# Patient Record
Sex: Female | Born: 1960 | Hispanic: No | Marital: Married | Smoking: Former smoker
Health system: Southern US, Community
[De-identification: ages and names within clinical notes are randomized; demographics above are authoritative.]

## PROBLEM LIST (undated history)

## (undated) DIAGNOSIS — J449 Chronic obstructive pulmonary disease, unspecified: Secondary | ICD-10-CM

## (undated) DIAGNOSIS — F419 Anxiety disorder, unspecified: Secondary | ICD-10-CM

## (undated) DIAGNOSIS — E785 Hyperlipidemia, unspecified: Secondary | ICD-10-CM

## (undated) DIAGNOSIS — E039 Hypothyroidism, unspecified: Secondary | ICD-10-CM

## (undated) DIAGNOSIS — I10 Essential (primary) hypertension: Secondary | ICD-10-CM

## (undated) DIAGNOSIS — E119 Type 2 diabetes mellitus without complications: Secondary | ICD-10-CM

## (undated) DIAGNOSIS — F329 Major depressive disorder, single episode, unspecified: Secondary | ICD-10-CM

## (undated) DIAGNOSIS — F32A Depression, unspecified: Secondary | ICD-10-CM

## (undated) DIAGNOSIS — F319 Bipolar disorder, unspecified: Secondary | ICD-10-CM

## (undated) DIAGNOSIS — K7460 Unspecified cirrhosis of liver: Secondary | ICD-10-CM

## (undated) DIAGNOSIS — K7581 Nonalcoholic steatohepatitis (NASH): Secondary | ICD-10-CM

## (undated) HISTORY — PX: CHOLECYSTECTOMY: SHX55

---

## 2004-12-20 ENCOUNTER — Ambulatory Visit (HOSPITAL_COMMUNITY): Admission: RE | Admit: 2004-12-20 | Discharge: 2004-12-20 | Payer: Self-pay | Admitting: Cardiology

## 2004-12-20 ENCOUNTER — Ambulatory Visit: Payer: Self-pay | Admitting: Cardiology

## 2011-06-26 ENCOUNTER — Other Ambulatory Visit: Payer: Self-pay | Admitting: Internal Medicine

## 2011-06-26 DIAGNOSIS — K7460 Unspecified cirrhosis of liver: Secondary | ICD-10-CM

## 2011-07-15 ENCOUNTER — Inpatient Hospital Stay: Admission: RE | Admit: 2011-07-15 | Payer: Self-pay | Source: Ambulatory Visit

## 2014-10-20 ENCOUNTER — Other Ambulatory Visit: Payer: Self-pay | Admitting: *Deleted

## 2014-10-20 DIAGNOSIS — L97919 Non-pressure chronic ulcer of unspecified part of right lower leg with unspecified severity: Principal | ICD-10-CM

## 2014-10-20 DIAGNOSIS — I83019 Varicose veins of right lower extremity with ulcer of unspecified site: Secondary | ICD-10-CM

## 2014-11-24 ENCOUNTER — Encounter: Payer: Self-pay | Admitting: Vascular Surgery

## 2014-11-25 ENCOUNTER — Encounter (HOSPITAL_COMMUNITY): Payer: Self-pay

## 2014-11-25 ENCOUNTER — Encounter: Payer: Self-pay | Admitting: Vascular Surgery

## 2015-02-07 ENCOUNTER — Telehealth: Payer: Self-pay | Admitting: *Deleted

## 2015-02-07 NOTE — Telephone Encounter (Signed)
The orders were in his outbox this morning.  I have called [facility] and spoke with Debbie to let her know they were signed/faxed in error and that Dr. Daiva EvesVan Dam is NOT following this patient.  Eunice BlaseDebbie will work with pharmacy/nursing to find the correct provider. Rebakah Cokley  ===View-only below this line===  ----- Message -----    From: Glorious PeachKimberly A Marley    Sent: 02/07/2015   4:19 PM      To: Andree CossMichelle M Deverick Pruss, RN Subject: FW: NEW PATIENT APPOINTMENT?                   Hi Micalah Cabezas -   Pls see below from Dr. Daiva EvesVan Dam.    You may be able to explain this better to him about this [facility] patient.  Thanks Kim   ----- Message -----    From: Randall Hissornelius N Van Dam, MD    Sent: 02/07/2015   4:05 PM      To: Glorious PeachKimberly A Marley Subject: RE: NEW PATIENT APPOINTMENT?                   I should NEVER have signed any order for  Her. I believe she [facility] on antibiotics that I had recommended but I was never asked to manage her antibiotics nor to see the patient. [facility] had called me about a critical lab on her and I told them to send her to the ED and that I was not in charge of her antibiotics.  I cannot have signed orders for her because I have never seen her and have not been in clinic for 2 weeks other than today  [facility] needs to straighten out WHO sent her out on antibiotics because it was NOT me since i dont work at [employer] ----- Message -----    From: Glorious PeachKimberly A Marley    Sent: 02/07/2015  12:03 PM      To: Randall Hissornelius N Van Dam, MD Subject: NEW PATIENT APPOINTMENT?                       Kees -   Do you know anything about Ms. Newcombe?  [facility] in [facility].  An order has been signed by you  HomeHealth dx is diabetic ulcer - 6 wk IV antibiotic.  Charitie Hinote in clinic called.   Does this patient need to be seen in our clinic?   Thanks, Selena BattenKim

## 2015-02-08 NOTE — Telephone Encounter (Signed)
Thanks Michelle

## 2015-10-20 DIAGNOSIS — K7460 Unspecified cirrhosis of liver: Secondary | ICD-10-CM

## 2015-10-20 DIAGNOSIS — K7581 Nonalcoholic steatohepatitis (NASH): Secondary | ICD-10-CM

## 2015-10-20 DIAGNOSIS — E039 Hypothyroidism, unspecified: Secondary | ICD-10-CM

## 2015-10-20 DIAGNOSIS — D696 Thrombocytopenia, unspecified: Secondary | ICD-10-CM

## 2015-10-20 DIAGNOSIS — N179 Acute kidney failure, unspecified: Secondary | ICD-10-CM | POA: Diagnosis not present

## 2015-10-21 DIAGNOSIS — K7581 Nonalcoholic steatohepatitis (NASH): Secondary | ICD-10-CM | POA: Diagnosis not present

## 2015-10-21 DIAGNOSIS — N289 Disorder of kidney and ureter, unspecified: Secondary | ICD-10-CM

## 2015-10-21 DIAGNOSIS — D696 Thrombocytopenia, unspecified: Secondary | ICD-10-CM | POA: Diagnosis not present

## 2015-10-21 DIAGNOSIS — N179 Acute kidney failure, unspecified: Secondary | ICD-10-CM | POA: Diagnosis not present

## 2015-10-22 DIAGNOSIS — N289 Disorder of kidney and ureter, unspecified: Secondary | ICD-10-CM | POA: Diagnosis not present

## 2015-10-22 DIAGNOSIS — K7581 Nonalcoholic steatohepatitis (NASH): Secondary | ICD-10-CM | POA: Diagnosis not present

## 2015-10-22 DIAGNOSIS — N179 Acute kidney failure, unspecified: Secondary | ICD-10-CM | POA: Diagnosis not present

## 2015-10-22 DIAGNOSIS — D696 Thrombocytopenia, unspecified: Secondary | ICD-10-CM | POA: Diagnosis not present

## 2015-10-23 DIAGNOSIS — D696 Thrombocytopenia, unspecified: Secondary | ICD-10-CM | POA: Diagnosis not present

## 2015-10-23 DIAGNOSIS — K7581 Nonalcoholic steatohepatitis (NASH): Secondary | ICD-10-CM | POA: Diagnosis not present

## 2015-10-23 DIAGNOSIS — N289 Disorder of kidney and ureter, unspecified: Secondary | ICD-10-CM | POA: Diagnosis not present

## 2015-10-23 DIAGNOSIS — N179 Acute kidney failure, unspecified: Secondary | ICD-10-CM | POA: Diagnosis not present

## 2015-10-29 DIAGNOSIS — K7460 Unspecified cirrhosis of liver: Secondary | ICD-10-CM

## 2015-10-29 DIAGNOSIS — N179 Acute kidney failure, unspecified: Secondary | ICD-10-CM | POA: Diagnosis not present

## 2015-10-29 DIAGNOSIS — L0390 Cellulitis, unspecified: Secondary | ICD-10-CM

## 2015-10-29 DIAGNOSIS — D638 Anemia in other chronic diseases classified elsewhere: Secondary | ICD-10-CM

## 2015-10-29 DIAGNOSIS — E7220 Disorder of urea cycle metabolism, unspecified: Secondary | ICD-10-CM

## 2015-10-29 DIAGNOSIS — L03119 Cellulitis of unspecified part of limb: Secondary | ICD-10-CM

## 2015-10-30 DIAGNOSIS — K7460 Unspecified cirrhosis of liver: Secondary | ICD-10-CM | POA: Diagnosis not present

## 2015-10-30 DIAGNOSIS — D638 Anemia in other chronic diseases classified elsewhere: Secondary | ICD-10-CM | POA: Diagnosis not present

## 2015-10-30 DIAGNOSIS — E7220 Disorder of urea cycle metabolism, unspecified: Secondary | ICD-10-CM | POA: Diagnosis not present

## 2015-10-30 DIAGNOSIS — N179 Acute kidney failure, unspecified: Secondary | ICD-10-CM | POA: Diagnosis not present

## 2015-10-31 ENCOUNTER — Encounter (HOSPITAL_COMMUNITY): Payer: Self-pay | Admitting: Internal Medicine

## 2015-10-31 ENCOUNTER — Inpatient Hospital Stay (HOSPITAL_COMMUNITY)
Admission: AD | Admit: 2015-10-31 | Discharge: 2015-11-04 | DRG: 442 | Disposition: A | Discharge status: Other Acute Inpatient Hospital | Payer: Medicare Other | Source: Other Acute Inpatient Hospital | Attending: Internal Medicine | Admitting: Internal Medicine

## 2015-10-31 DIAGNOSIS — E872 Acidosis: Secondary | ICD-10-CM | POA: Diagnosis present

## 2015-10-31 DIAGNOSIS — S91301A Unspecified open wound, right foot, initial encounter: Secondary | ICD-10-CM | POA: Diagnosis present

## 2015-10-31 DIAGNOSIS — K7290 Hepatic failure, unspecified without coma: Secondary | ICD-10-CM | POA: Diagnosis present

## 2015-10-31 DIAGNOSIS — N179 Acute kidney failure, unspecified: Secondary | ICD-10-CM | POA: Diagnosis present

## 2015-10-31 DIAGNOSIS — E11610 Type 2 diabetes mellitus with diabetic neuropathic arthropathy: Secondary | ICD-10-CM | POA: Diagnosis present

## 2015-10-31 DIAGNOSIS — E785 Hyperlipidemia, unspecified: Secondary | ICD-10-CM | POA: Diagnosis present

## 2015-10-31 DIAGNOSIS — D689 Coagulation defect, unspecified: Secondary | ICD-10-CM | POA: Diagnosis present

## 2015-10-31 DIAGNOSIS — D638 Anemia in other chronic diseases classified elsewhere: Secondary | ICD-10-CM | POA: Diagnosis not present

## 2015-10-31 DIAGNOSIS — Z87891 Personal history of nicotine dependence: Secondary | ICD-10-CM | POA: Diagnosis not present

## 2015-10-31 DIAGNOSIS — E11621 Type 2 diabetes mellitus with foot ulcer: Secondary | ICD-10-CM | POA: Diagnosis present

## 2015-10-31 DIAGNOSIS — K7460 Unspecified cirrhosis of liver: Secondary | ICD-10-CM | POA: Diagnosis present

## 2015-10-31 DIAGNOSIS — N189 Chronic kidney disease, unspecified: Secondary | ICD-10-CM

## 2015-10-31 DIAGNOSIS — E1122 Type 2 diabetes mellitus with diabetic chronic kidney disease: Secondary | ICD-10-CM | POA: Diagnosis present

## 2015-10-31 DIAGNOSIS — S91301D Unspecified open wound, right foot, subsequent encounter: Secondary | ICD-10-CM | POA: Diagnosis not present

## 2015-10-31 DIAGNOSIS — K767 Hepatorenal syndrome: Secondary | ICD-10-CM | POA: Diagnosis present

## 2015-10-31 DIAGNOSIS — I959 Hypotension, unspecified: Secondary | ICD-10-CM | POA: Diagnosis present

## 2015-10-31 DIAGNOSIS — Z882 Allergy status to sulfonamides status: Secondary | ICD-10-CM | POA: Diagnosis not present

## 2015-10-31 DIAGNOSIS — I739 Peripheral vascular disease, unspecified: Secondary | ICD-10-CM | POA: Diagnosis present

## 2015-10-31 DIAGNOSIS — E039 Hypothyroidism, unspecified: Secondary | ICD-10-CM | POA: Diagnosis present

## 2015-10-31 DIAGNOSIS — E7220 Disorder of urea cycle metabolism, unspecified: Secondary | ICD-10-CM | POA: Diagnosis not present

## 2015-10-31 DIAGNOSIS — J441 Chronic obstructive pulmonary disease with (acute) exacerbation: Secondary | ICD-10-CM | POA: Diagnosis present

## 2015-10-31 DIAGNOSIS — R188 Other ascites: Secondary | ICD-10-CM | POA: Diagnosis present

## 2015-10-31 DIAGNOSIS — Z886 Allergy status to analgesic agent status: Secondary | ICD-10-CM

## 2015-10-31 DIAGNOSIS — K7581 Nonalcoholic steatohepatitis (NASH): Secondary | ICD-10-CM | POA: Diagnosis present

## 2015-10-31 DIAGNOSIS — E1151 Type 2 diabetes mellitus with diabetic peripheral angiopathy without gangrene: Secondary | ICD-10-CM | POA: Diagnosis present

## 2015-10-31 DIAGNOSIS — F319 Bipolar disorder, unspecified: Secondary | ICD-10-CM | POA: Diagnosis present

## 2015-10-31 DIAGNOSIS — Z881 Allergy status to other antibiotic agents status: Secondary | ICD-10-CM | POA: Diagnosis not present

## 2015-10-31 DIAGNOSIS — R109 Unspecified abdominal pain: Secondary | ICD-10-CM | POA: Diagnosis present

## 2015-10-31 DIAGNOSIS — I10 Essential (primary) hypertension: Secondary | ICD-10-CM | POA: Diagnosis present

## 2015-10-31 HISTORY — DX: Major depressive disorder, single episode, unspecified: F32.9

## 2015-10-31 HISTORY — DX: Hypothyroidism, unspecified: E03.9

## 2015-10-31 HISTORY — DX: Hyperlipidemia, unspecified: E78.5

## 2015-10-31 HISTORY — DX: Depression, unspecified: F32.A

## 2015-10-31 HISTORY — DX: Unspecified cirrhosis of liver: K74.60

## 2015-10-31 HISTORY — DX: Essential (primary) hypertension: I10

## 2015-10-31 HISTORY — DX: Type 2 diabetes mellitus without complications: E11.9

## 2015-10-31 HISTORY — DX: Anxiety disorder, unspecified: F41.9

## 2015-10-31 HISTORY — DX: Nonalcoholic steatohepatitis (NASH): K75.81

## 2015-10-31 HISTORY — DX: Bipolar disorder, unspecified: F31.9

## 2015-10-31 HISTORY — DX: Chronic obstructive pulmonary disease, unspecified: J44.9

## 2015-10-31 LAB — URINALYSIS W MICROSCOPIC (NOT AT ARMC)
Bilirubin Urine: NEGATIVE
GLUCOSE, UA: NEGATIVE mg/dL
KETONES UR: NEGATIVE mg/dL
NITRITE: NEGATIVE
PH: 5 (ref 5.0–8.0)
PROTEIN: 30 mg/dL — AB
Specific Gravity, Urine: 1.024 (ref 1.005–1.030)

## 2015-10-31 LAB — MRSA PCR SCREENING: MRSA BY PCR: NEGATIVE

## 2015-10-31 LAB — GLUCOSE, CAPILLARY: Glucose-Capillary: 131 mg/dL — ABNORMAL HIGH (ref 65–99)

## 2015-10-31 LAB — SODIUM, URINE, RANDOM

## 2015-10-31 MED ORDER — SODIUM CHLORIDE 0.9 % IV SOLN: 12.5000 mg | Freq: Four times a day (QID) | INTRAVENOUS | Status: DC | PRN

## 2015-10-31 MED ORDER — METOCLOPRAMIDE HCL 5 MG PO TABS
5.0000 mg | ORAL_TABLET | Freq: Two times a day (BID) | ORAL | Status: DC
  Administered 2015-10-31 – 2015-11-03 (×7): 5 mg via ORAL
  Filled 2015-10-31 (×7): qty 1

## 2015-10-31 MED ORDER — PROMETHAZINE HCL 25 MG/ML IJ SOLN
12.5000 mg | Freq: Four times a day (QID) | INTRAMUSCULAR | Status: DC | PRN
  Administered 2015-11-01 – 2015-11-02 (×2): 12.5 mg via INTRAVENOUS
  Filled 2015-10-31 (×2): qty 1

## 2015-10-31 MED ORDER — ONDANSETRON HCL 4 MG/2ML IJ SOLN
4.0000 mg | Freq: Four times a day (QID) | INTRAMUSCULAR | Status: DC | PRN
Start: 2015-10-31 — End: 2015-11-04
  Administered 2015-11-03: 4 mg via INTRAVENOUS
  Filled 2015-10-31: qty 2

## 2015-10-31 MED ORDER — LACTULOSE 10 GM/15ML PO SOLN
20.0000 g | Freq: Two times a day (BID) | ORAL | Status: DC
  Administered 2015-10-31 – 2015-11-03 (×7): 20 g via ORAL
  Filled 2015-10-31 (×7): qty 30

## 2015-10-31 MED ORDER — IPRATROPIUM-ALBUTEROL 0.5-2.5 (3) MG/3ML IN SOLN
3.0000 mL | Freq: Four times a day (QID) | RESPIRATORY_TRACT | Status: DC
Start: 2015-10-31 — End: 2015-10-31
  Administered 2015-10-31: 3 mL via RESPIRATORY_TRACT
  Filled 2015-10-31: qty 3

## 2015-10-31 MED ORDER — ONDANSETRON HCL 4 MG PO TABS: 4.0000 mg | ORAL_TABLET | Freq: Four times a day (QID) | ORAL | Status: DC | PRN

## 2015-10-31 MED ORDER — DEXTROSE 5 % IV SOLN
2.0000 g | INTRAVENOUS | Status: DC
  Administered 2015-10-31 – 2015-11-03 (×4): 2 g via INTRAVENOUS
  Filled 2015-10-31 (×5): qty 2

## 2015-10-31 MED ORDER — IPRATROPIUM-ALBUTEROL 0.5-2.5 (3) MG/3ML IN SOLN
3.0000 mL | Freq: Two times a day (BID) | RESPIRATORY_TRACT | Status: DC
  Administered 2015-11-01: 3 mL via RESPIRATORY_TRACT
  Filled 2015-10-31: qty 3

## 2015-10-31 MED ORDER — HYDROMORPHONE HCL 1 MG/ML IJ SOLN: 0.5000 mg | INTRAMUSCULAR | Status: DC | PRN

## 2015-10-31 MED ORDER — CIPROFLOXACIN HCL 500 MG PO TABS
500.0000 mg | ORAL_TABLET | Freq: Two times a day (BID) | ORAL | Status: DC
  Administered 2015-10-31 – 2015-11-01 (×3): 500 mg via ORAL
  Filled 2015-10-31 (×4): qty 1

## 2015-10-31 MED ORDER — SODIUM CHLORIDE 0.9% FLUSH
3.0000 mL | Freq: Two times a day (BID) | INTRAVENOUS | Status: DC
  Administered 2015-10-31 – 2015-11-03 (×6): 3 mL via INTRAVENOUS

## 2015-10-31 MED ORDER — BUPROPION HCL ER (SR) 100 MG PO TB12
200.0000 mg | ORAL_TABLET | Freq: Two times a day (BID) | ORAL | Status: DC
  Administered 2015-10-31 – 2015-11-03 (×7): 200 mg via ORAL
  Filled 2015-10-31 (×9): qty 2

## 2015-10-31 MED ORDER — SODIUM CHLORIDE 0.9 % IV SOLN
50.0000 ug/h | INTRAVENOUS | Status: DC
  Administered 2015-10-31 – 2015-11-02 (×2): 12.5 ug/h via INTRAVENOUS
  Administered 2015-11-03: 50 ug/h via INTRAVENOUS
  Filled 2015-10-31 (×6): qty 1

## 2015-10-31 MED ORDER — PANTOPRAZOLE SODIUM 40 MG PO TBEC
40.0000 mg | DELAYED_RELEASE_TABLET | Freq: Every day | ORAL | Status: DC
  Administered 2015-10-31 – 2015-11-03 (×4): 40 mg via ORAL
  Filled 2015-10-31 (×4): qty 1

## 2015-10-31 MED ORDER — FLUTICASONE PROPIONATE 50 MCG/ACT NA SUSP
1.0000 | Freq: Every day | NASAL | Status: DC | PRN
  Filled 2015-10-31: qty 16

## 2015-10-31 MED ORDER — OXYCODONE HCL 5 MG PO TABS
10.0000 mg | ORAL_TABLET | ORAL | Status: DC | PRN
  Administered 2015-10-31 – 2015-11-03 (×12): 10 mg via ORAL
  Filled 2015-10-31 (×12): qty 2

## 2015-10-31 MED ORDER — LEVOTHYROXINE SODIUM 50 MCG PO TABS
50.0000 ug | ORAL_TABLET | Freq: Every day | ORAL | Status: DC
  Administered 2015-11-01 – 2015-11-03 (×3): 50 ug via ORAL
  Filled 2015-10-31 (×3): qty 1

## 2015-10-31 NOTE — H&P (Signed)
History and Physical    Bridget Shaw GOT:[id number] DOB: [birth year] DOA: 10/31/2015  PCP: Jerold Coombe   Patient coming from: [facility].  Chief Complaint: Ascites.  HPI: Bridget Shaw is a 55 y.o. female with medical history significant of liver cirrhosis secondary to [place], anxiety, bipolar disorder, COPD, depression, type 2 diabetes, hyperlipidemia, hypothyroidism who was transferred from [facility] due to decompensated ascites with worsening renal function.   The patient was admitted on 10/20/2015 with confusion and mild dehydration after large volume paracentesis was done. She was was discharged home on diuretics a few days, but returned again on 10/29/2015 to [facility] due to refractory ascites. She had a paracentesis done on 10/30/2015, but only 4 L of ascitic fluid were drained due to concern  with her renal function. Her renal function has been steadily climbing from a baseline of 1.2 mg/dL about 6 months ago to 2.5 mg/dL earlier today.  Due to this, Dr. Mahala Menghini spoke to the patient's hepatologist at [facility] who recommended to transfer the patient for evaluation of TIPS procedure to see if this would benefit the patient. Unfortunately, [facility] did not have any beds available and the patient agreed to come to [facility]. Interventional radiology and nephrology have been consulted.   Review of Systems: As per HPI otherwise 10 point review of systems negative.    Past Medical History:  Diagnosis Date  . Anxiety   . Bipolar disorder (HCC)   . COPD (chronic obstructive pulmonary disease) (HCC)   . Depression   . Diabetes mellitus without complication (HCC)   . Hyperlipidemia   . Hypertension   . Hypothyroidism   . Liver cirrhosis secondary to NASH     Past Surgical History:  Procedure Laterality Date  . CHOLECYSTECTOMY       reports that she quit smoking 12 days ago. She has never used smokeless tobacco. She reports that she does not drink  alcohol or use drugs.  Allergies  Allergen Reactions  . Asa [Aspirin] Hives  . Bactrim [Sulfamethoxazole-Trimethoprim] Hives  . Cephalexin Hives  . Sulfa Antibiotics Hives    Family History  Problem Relation Age of Onset  . Lung cancer Mother   . Diabetes Mother   . Hypertension Mother   . Diabetes Father   . Hypertension Father     Prior to Admission medications   Not on File    Physical Exam: Vitals:   10/31/15 2016 10/31/15 2234  BP: (!) 88/63   Pulse: 88   Resp: (!) 23   Temp: 99.3 F (37.4 C)   SpO2: 100% 100%  Weight: 112 kg (247 lb)   Height: 5\' 4"  (1.626 m)       Constitutional: NAD, calm, comfortable Vitals:   10/31/15 2016 10/31/15 2234  BP: (!) 88/63   Pulse: 88   Resp: (!) 23   Temp: 99.3 F (37.4 C)   SpO2: 100% 100%  Weight: 112 kg (247 lb)   Height: 5\' 4"  (1.626 m)    Eyes: PERRL, Icteric sclerae, lids and conjunctivae normal ENMT: Mucous membranes are moist. Posterior pharynx clear of any exudate or lesions. Neck: normal, supple, no masses, no thyromegaly Respiratory: Clear to auscultation bilaterally, no wheezing, no crackles. Normal respiratory effort.  Cardiovascular: Regular rate and rhythm, no murmurs / rubs / gallops. No extremity edema. 2+ pedal pulses. No carotid bruits.  Abdomen: Severely distended, mild diffuse tenderness without guarding or rebound, no masses palpated. Bowel sounds positive.  Musculoskeletal: Right foot ulcer  extending through the posterior and lateral aspect of the heel. No discharge noticed. Skin: Multiple ecchymosis area on extremities and lower back. Neurologic: CN 2-12 grossly intact. Sensation intact, DTR normal. Generalized weakness.  Psychiatric: Normal judgment and insight. Alert and oriented x 4. Normal mood.     Labs on Admission: I have personally reviewed following labs and imaging studies  CBG:  Recent Labs Lab 10/31/15 2014  GLUCAP 131*    Recent Results (from the past 240 hour(s))    MRSA PCR Screening     Status: None   Collection Time: 10/31/15  8:14 PM  Result Value Ref Range Status   MRSA by PCR NEGATIVE NEGATIVE Final    Comment:        The GeneXpert MRSA Assay (FDA approved for NASAL specimens only), is one component of a comprehensive MRSA colonization surveillance program. It is not intended to diagnose MRSA infection nor to guide or monitor treatment for MRSA infections.        Assessment/Plan Principal Problem:   Acute on chronic renal failure (HCC) Admit to telemetry/inpatient. Check urinalysis. Check random urine sodium. Monitor renal function daily. Continue albumin infusion as needed. Continue octreotide infusion. Consider adding Midodrine  Nephrology will evaluate in the morning.  Active Problems:   Ascites Continue IV Rocephin to cover for SBP. Interventional radiology will evaluate for TIPS procedure in the morning. Spironolactone, furosemide and propranolol have been held. Also the patient has a relatively low MELD score, the presence of renal involvement increases her mortality risk.    Abdominal pain Continue analgesics as needed as blood pressure allows.    Bipolar disorder (HCC) Stable. Continue bupropion 200 mg by mouth every 12 hours.      COPD (HCC) Continue supplemental oxygen. Bronchodilators as needed.    Chronic wound of right foot   Peripheral vascular disease (HCC) Continue local wound care. Continue ciprofloxacin 500 mg by mouth twice a day   DVT prophylaxis: SCDs. Code Status: Full code. Family Communication:  Disposition Plan: Admit for evaluation by IR and nephrology. Consults called: Interventional radiology (Dr. Archer Asa) and nephrology (Dr. Kathrene Bongo). Admission status: Inpatient/telemetry.   Bobette Mo MD [facility] Pager [phone].  If 7PM-7AM, please contact night-coverage [email or web address] Password [facility]  10/31/2015, 10:47 PM

## 2015-11-01 ENCOUNTER — Inpatient Hospital Stay (HOSPITAL_COMMUNITY): Payer: Medicare Other

## 2015-11-01 ENCOUNTER — Encounter (HOSPITAL_COMMUNITY): Payer: Self-pay | Admitting: General Surgery

## 2015-11-01 DIAGNOSIS — K7460 Unspecified cirrhosis of liver: Secondary | ICD-10-CM | POA: Diagnosis present

## 2015-11-01 DIAGNOSIS — K7290 Hepatic failure, unspecified without coma: Secondary | ICD-10-CM | POA: Diagnosis present

## 2015-11-01 DIAGNOSIS — S91301D Unspecified open wound, right foot, subsequent encounter: Secondary | ICD-10-CM

## 2015-11-01 LAB — COMPREHENSIVE METABOLIC PANEL
ALBUMIN: 3.4 g/dL — AB (ref 3.5–5.0)
ALT: 14 U/L (ref 14–54)
AST: 17 U/L (ref 15–41)
Alkaline Phosphatase: 77 U/L (ref 38–126)
Anion gap: 8 (ref 5–15)
BILIRUBIN TOTAL: 1 mg/dL (ref 0.3–1.2)
BUN: 41 mg/dL — AB (ref 6–20)
CHLORIDE: 106 mmol/L (ref 101–111)
CO2: 18 mmol/L — AB (ref 22–32)
Calcium: 7.9 mg/dL — ABNORMAL LOW (ref 8.9–10.3)
Creatinine, Ser: 2.73 mg/dL — ABNORMAL HIGH (ref 0.44–1.00)
GFR calc Af Amer: 22 mL/min — ABNORMAL LOW (ref >60)
GFR calc non Af Amer: 19 mL/min — ABNORMAL LOW (ref >60)
GLUCOSE: 148 mg/dL — AB (ref 65–99)
POTASSIUM: 4.2 mmol/L (ref 3.5–5.1)
SODIUM: 132 mmol/L — AB (ref 135–145)
TOTAL PROTEIN: 5.9 g/dL — AB (ref 6.5–8.1)

## 2015-11-01 LAB — CBC WITH DIFFERENTIAL/PLATELET
BASOS ABS: 0 10*3/uL (ref 0.0–0.1)
Basophils Relative: 0 %
EOS ABS: 0.2 10*3/uL (ref 0.0–0.7)
Eosinophils Relative: 3 %
HCT: 32.1 % — ABNORMAL LOW (ref 36.0–46.0)
HEMOGLOBIN: 10.2 g/dL — AB (ref 12.0–15.0)
LYMPHS ABS: 0.9 10*3/uL (ref 0.7–4.0)
LYMPHS PCT: 15 %
MCH: 27 pg (ref 26.0–34.0)
MCHC: 31.8 g/dL (ref 30.0–36.0)
MCV: 84.9 fL (ref 78.0–100.0)
Monocytes Absolute: 0.5 10*3/uL (ref 0.1–1.0)
Monocytes Relative: 8 %
NEUTROS PCT: 73 %
Neutro Abs: 4.5 10*3/uL (ref 1.7–7.7)
Platelets: 91 10*3/uL — ABNORMAL LOW (ref 150–400)
RBC: 3.78 MIL/uL — AB (ref 3.87–5.11)
RDW: 16.6 % — ABNORMAL HIGH (ref 11.5–15.5)
WBC: 6.1 10*3/uL (ref 4.0–10.5)

## 2015-11-01 LAB — PHOSPHORUS: Phosphorus: 5.9 mg/dL — ABNORMAL HIGH (ref 2.5–4.6)

## 2015-11-01 LAB — GLUCOSE, CAPILLARY
GLUCOSE-CAPILLARY: 139 mg/dL — AB (ref 65–99)
GLUCOSE-CAPILLARY: 135 mg/dL — AB (ref 65–99)
Glucose-Capillary: 125 mg/dL — ABNORMAL HIGH (ref 65–99)
Glucose-Capillary: 130 mg/dL — ABNORMAL HIGH (ref 65–99)

## 2015-11-01 LAB — MAGNESIUM: Magnesium: 2.1 mg/dL (ref 1.7–2.4)

## 2015-11-01 LAB — PROTIME-INR
INR: 1.62
PROTHROMBIN TIME: 19.3 s — AB (ref 11.4–15.2)

## 2015-11-01 MED ORDER — ONDANSETRON HCL 4 MG PO TABS: 4.0000 mg | ORAL_TABLET | Freq: Four times a day (QID) | ORAL | 0 refills | Status: DC | PRN

## 2015-11-01 MED ORDER — SODIUM CHLORIDE 0.9 % IV SOLN: 12.5000 ug/h | INTRAVENOUS | 0 refills | Status: AC

## 2015-11-01 MED ORDER — ALBUMIN HUMAN 25 % IV SOLN
25.0000 g | Freq: Once | INTRAVENOUS | Status: DC
  Filled 2015-11-01: qty 100

## 2015-11-01 MED ORDER — DEXTROSE 5 % IV SOLN: 2.0000 g | INTRAVENOUS | 0 refills | Status: DC

## 2015-11-01 MED ORDER — IPRATROPIUM-ALBUTEROL 0.5-2.5 (3) MG/3ML IN SOLN: 3.0000 mL | Freq: Four times a day (QID) | RESPIRATORY_TRACT | Status: DC | PRN

## 2015-11-01 MED ORDER — ALBUMIN HUMAN 25 % IV SOLN
25.0000 g | Freq: Three times a day (TID) | INTRAVENOUS | Status: DC
  Administered 2015-11-01 – 2015-11-03 (×8): 25 g via INTRAVENOUS
  Filled 2015-11-01 (×9): qty 100

## 2015-11-01 MED ORDER — SODIUM CHLORIDE 0.9 % IV SOLN
INTRAVENOUS | Status: DC
  Administered 2015-11-01 – 2015-11-02 (×2): via INTRAVENOUS

## 2015-11-01 MED ORDER — MIDODRINE HCL 5 MG PO TABS
10.0000 mg | ORAL_TABLET | Freq: Three times a day (TID) | ORAL | Status: DC
  Administered 2015-11-01 – 2015-11-03 (×8): 10 mg via ORAL
  Filled 2015-11-01 (×8): qty 2

## 2015-11-01 MED ORDER — CETYLPYRIDINIUM CHLORIDE 0.05 % MT LIQD
7.0000 mL | Freq: Two times a day (BID) | OROMUCOSAL | Status: DC
  Administered 2015-11-01 – 2015-11-03 (×5): 7 mL via OROMUCOSAL

## 2015-11-01 MED ORDER — MIDODRINE HCL 10 MG PO TABS: 10.0000 mg | ORAL_TABLET | Freq: Three times a day (TID) | ORAL | 0 refills | Status: DC

## 2015-11-01 NOTE — Consult Note (Addendum)
[facility] Consult Note  Referring Provider: No ref. provider found Primary Care Physician:  Hilbert Corrigan Primary Gastroenterologist:  Dr.  Laurel Dimmer Complaint: Abdominal tightness and shortness of breath HPI: Bridget Shaw is an 55 y.o. white female  with at least a 7 year history of cirrhosis presumed secondary to Karlene Lineman, who presents one week after recent admission-per a Hospital for refractory ascites and encephalopathy with recurrent refractory ascites unresponsive to a 2 L paracentesis 2 days ago. In addition she has had a rising creatinine which is now 2.6. I'm not certain over how long a period that has risen. The patient was referred to [facility] where she is a patient was last seen there 7 months ago but they did not have an available bed and was recommend she come here, specifically mentioning the possibility of a TI PS. She is complaining of generalized abdominal pain. She has never had any GI bleeding and has never consumed alcohol.  Past Medical History:  Diagnosis Date  . Anxiety   . Bipolar disorder ([place])   . COPD (chronic obstructive pulmonary disease) ([place])   . Depression   . Diabetes mellitus without complication ([place])   . Hyperlipidemia   . Hypertension   . Hypothyroidism   . Liver cirrhosis secondary to NASH     Past Surgical History:  Procedure Laterality Date  . CHOLECYSTECTOMY      No prescriptions prior to admission.    Allergies:  Allergies  Allergen Reactions  . Asa [Aspirin] Hives  . Bactrim [Sulfamethoxazole-Trimethoprim] Hives  . Cephalexin Hives  . Sulfa Antibiotics Hives    Family History  Problem Relation Age of Onset  . Lung cancer Mother   . Diabetes Mother   . Hypertension Mother   . Diabetes Father   . Hypertension Father     Social History:  reports that she quit smoking about 1 weeks ago. She has never used smokeless tobacco. She reports that she does not drink alcohol or use drugs.  Review of Systems:  negative except As above   Blood pressure 101/64, pulse (!) 104, temperature 98.8 F (37.1 C), resp. rate 20, height _0  (1.626 m), weight 112 kg (247 lb), SpO2 97 %. Head: Normocephalic, without obvious abnormality, atraumatic Neck: no adenopathy, no carotid bruit, no JVD, supple, symmetrical, trachea midline and thyroid not enlarged, symmetric, no tenderness/mass/nodules Resp: clear to auscultation bilaterally Cardio: regular rate and rhythm, S1, S2 normal, no murmur, click, rub or gallop GI: Abdomen grossly and symmetrically distended. Mild diffuse tenderness with obvious ascites. Extremities: extremities normal, atraumatic, no cyanosis or edema  Results for orders placed or performed during the hospital encounter of 10/31/15 (from the past 48 hour(s))  MRSA PCR Screening     Status: None   Collection Time: 10/31/15  8:14 PM  Result Value Ref Range   MRSA by PCR NEGATIVE NEGATIVE    Comment:        The GeneXpert MRSA Assay (FDA approved for NASAL specimens only), is one component of a comprehensive MRSA colonization surveillance program. It is not intended to diagnose MRSA infection nor to guide or monitor treatment for MRSA infections.   Glucose, capillary     Status: Abnormal   Collection Time: 10/31/15  8:14 PM  Result Value Ref Range   Glucose-Capillary 131 (H) 65 - 99 mg/dL  Sodium, urine, random     Status: None   Collection Time: 10/31/15 10:51 PM  Result Value Ref Range   Sodium, Ur <10 mmol/L  Urinalysis with microscopic (not at [facility])     Status: Abnormal   Collection Time: 10/31/15 10:51 PM  Result Value Ref Range   Color, Urine AMBER (A) YELLOW    Comment: BIOCHEMICALS MAY BE AFFECTED BY COLOR   APPearance CLOUDY (A) CLEAR   Specific Gravity, Urine 1.024 1.005 - 1.030   pH 5.0 5.0 - 8.0   Glucose, UA NEGATIVE NEGATIVE mg/dL   Hgb urine dipstick LARGE (A) NEGATIVE   Bilirubin Urine NEGATIVE NEGATIVE   Ketones, ur NEGATIVE NEGATIVE mg/dL   Protein, ur 30  (A) NEGATIVE mg/dL   Nitrite NEGATIVE NEGATIVE   Leukocytes, UA SMALL (A) NEGATIVE   WBC, UA 0-5 0 - 5 WBC/hpf   RBC / HPF TOO NUMEROUS TO COUNT 0 - 5 RBC/hpf   Bacteria, UA RARE (A) NONE SEEN   Squamous Epithelial / LPF 0-5 (A) NONE SEEN   Casts HYALINE CASTS (A) NEGATIVE  CBC WITH DIFFERENTIAL     Status: Abnormal   Collection Time: 11/01/15  3:31 AM  Result Value Ref Range   WBC 6.1 4.0 - 10.5 K/uL   RBC 3.78 (L) 3.87 - 5.11 MIL/uL   Hemoglobin 10.2 (L) 12.0 - 15.0 g/dL   HCT 32.1 (L) 36.0 - 46.0 %   MCV 84.9 78.0 - 100.0 fL   MCH 27.0 26.0 - 34.0 pg   MCHC 31.8 30.0 - 36.0 g/dL   RDW 16.6 (H) 11.5 - 15.5 %   Platelets 91 (L) 150 - 400 K/uL    Comment: SPECIMEN CHECKED FOR CLOTS PLATELET COUNT CONFIRMED BY SMEAR    Neutrophils Relative % 73 %   Neutro Abs 4.5 1.7 - 7.7 K/uL   Lymphocytes Relative 15 %   Lymphs Abs 0.9 0.7 - 4.0 K/uL   Monocytes Relative 8 %   Monocytes Absolute 0.5 0.1 - 1.0 K/uL   Eosinophils Relative 3 %   Eosinophils Absolute 0.2 0.0 - 0.7 K/uL   Basophils Relative 0 %   Basophils Absolute 0.0 0.0 - 0.1 K/uL  Protime-INR     Status: Abnormal   Collection Time: 11/01/15  3:31 AM  Result Value Ref Range   Prothrombin Time 19.3 (H) 11.4 - 15.2 seconds   INR 1.62   Comprehensive metabolic panel     Status: Abnormal   Collection Time: 11/01/15  3:31 AM  Result Value Ref Range   Sodium 132 (L) 135 - 145 mmol/L   Potassium 4.2 3.5 - 5.1 mmol/L   Chloride 106 101 - 111 mmol/L   CO2 18 (L) 22 - 32 mmol/L   Glucose, Bld 148 (H) 65 - 99 mg/dL   BUN 41 (H) 6 - 20 mg/dL   Creatinine, Ser 2.73 (H) 0.44 - 1.00 mg/dL   Calcium 7.9 (L) 8.9 - 10.3 mg/dL   Total Protein 5.9 (L) 6.5 - 8.1 g/dL   Albumin 3.4 (L) 3.5 - 5.0 g/dL   AST 17 15 - 41 U/L   ALT 14 14 - 54 U/L   Alkaline Phosphatase 77 38 - 126 U/L   Total Bilirubin 1.0 0.3 - 1.2 mg/dL   GFR calc non Af Amer 19 (L) >60 mL/min   GFR calc Af Amer 22 (L) >60 mL/min    Comment: (NOTE) The eGFR has been  calculated using the CKD EPI equation. This calculation has not been validated in all clinical situations. eGFR's persistently <60 mL/min signify possible Chronic Kidney Disease.    Anion gap 8 5 - 15  Magnesium  Status: None   Collection Time: 11/01/15  3:31 AM  Result Value Ref Range   Magnesium 2.1 1.7 - 2.4 mg/dL  Phosphorus     Status: Abnormal   Collection Time: 11/01/15  3:31 AM  Result Value Ref Range   Phosphorus 5.9 (H) 2.5 - 4.6 mg/dL  Glucose, capillary     Status: Abnormal   Collection Time: 11/01/15  7:35 AM  Result Value Ref Range   Glucose-Capillary 139 (H) 65 - 99 mg/dL   No results found.  Assessment: 1. Presumed Karlene Lineman cirrhosis 2. Refractory ascites 3. 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 At least some degree of previous encephalopathy Plan:  1. I would recommend at least a diagnostic paracentesis and up to 2 L removal of fluid for palliation of dyspnea unless renal strongly disagrees 2. If paracentesis not done, would treat empirically for SBP with Rocephin or equivalent 3. Re: TIPS, the patient has a meld score of 21 and is a child-pugh class B minus. For the indication of refractory ascites she is probably acceptable but quite high risk, especially for encephalopathy but also in terms of overall mortality 4. Re: TIPS for the indication of hepatorenal syndrome there is some limited case study data supporting some benefit in selected cases but the American Association for study of liver diseases does not recommended pending more controlled studies. 5. Agree with renal and IR consults for ruling out any other causes of azotemia and input re: TIPS. 6. Overall I think this patient should be referred to [facility] when a bed is available. She has not been seen there in 8 months and has never been evaluated for liver transplant candidacy. Given her young age and absence of alcohol use or any other medically treatable etiology of her cirrhosis, I think transplant status  needs to be reconsidered very soon. Charne Mcbrien C 11/01/2015, 11:30 AM  Pager [phone] If no answer or after 5 PM call [phone]

## 2015-11-01 NOTE — Consult Note (Signed)
Chief Complaint: NASH with cirrhosis  Referring Physician:Dr. Tennis Must  Supervising Physician: Markus Daft  Patient Status: In-pt   HPI: Bridget Shaw is an 55 y.o. female with multiple medical problems who has developed NASH.  She has been developing ascites and underwent a large volume paracentesis recently at [facility].  She has since developed some renal failure from this and partly the reason for transfer to [facility].  She is normally controlled on Lasix and Spironolactone for her ascites.  She has had some encephalopathy about 2 weeks ago when she did know who she was, etc.  This has since improved.  She has been followed at [facility] by a hepatologist.  She has not seen them in 7 months, I believe.  Due to her worsening hepatorenal syndrome, we have been asked to evaluate the patient for a TIPS procedure to help with her fluid accumulation.  Past Medical History:  Past Medical History:  Diagnosis Date  . Anxiety   . Bipolar disorder ([place])   . COPD (chronic obstructive pulmonary disease) ([place])   . Depression   . Diabetes mellitus without complication ([place])   . Hyperlipidemia   . Hypertension   . Hypothyroidism   . Liver cirrhosis secondary to NASH     Past Surgical History:  Past Surgical History:  Procedure Laterality Date  . CHOLECYSTECTOMY      Family History:  Family History  Problem Relation Age of Onset  . Lung cancer Mother   . Diabetes Mother   . Hypertension Mother   . Diabetes Father   . Hypertension Father     Social History:  reports that she quit smoking about 1 weeks ago. She has never used smokeless tobacco. She reports that she does not drink alcohol or use drugs.  Allergies:  Allergies  Allergen Reactions  . Asa [Aspirin] Hives  . Bactrim [Sulfamethoxazole-Trimethoprim] Hives  . Cephalexin Hives  . Sulfa Antibiotics Hives    Medications:   Medication List    ASK your doctor about these medications   buPROPion 200 MG 12 hr  tablet Commonly known as:  WELLBUTRIN SR Take 1 tablet by mouth 2 (two) times daily.   ciprofloxacin 500 MG tablet Commonly known as:  CIPRO Take 1 tablet by mouth 2 (two) times daily.   clotrimazole 1 % cream Commonly known as:  LOTRIMIN Apply 1 application topically daily as needed.   COMBIVENT RESPIMAT 20-100 MCG/ACT Aers respimat Generic drug:  Ipratropium-Albuterol Inhale 2 puffs into the lungs daily as needed.   furosemide 40 MG tablet Commonly known as:  LASIX Take 1 tablet by mouth 2 (two) times daily.   lactulose 10 GM/15ML solution Commonly known as:  CHRONULAC Take 30 mLs by mouth daily.   levothyroxine 50 MCG tablet Commonly known as:  SYNTHROID, LEVOTHROID Take 1 tablet by mouth every morning.   LYRICA 100 MG capsule Generic drug:  pregabalin Take 1 capsule by mouth 2 (two) times daily.   mometasone 50 MCG/ACT nasal spray Commonly known as:  NASONEX Place 1 spray into the nose daily as needed.   nystatin powder Commonly known as:  MYCOSTATIN/NYSTOP Apply 1 application topically daily. Apply to right foot after shower   omeprazole 40 MG capsule Commonly known as:  PRILOSEC Take 1 capsule by mouth 2 (two) times daily.   oxyCODONE 15 MG immediate release tablet Commonly known as:  ROXICODONE Take 1-2 tablets by mouth See admin instructions. Pt takes 2 tablets in the morning, and 1 tablet  before bed   pravastatin 20 MG tablet Commonly known as:  PRAVACHOL Take 1 tablet by mouth every evening.   propranolol 20 MG tablet Commonly known as:  INDERAL Take 1 tablet by mouth 2 (two) times daily.   SUMAtriptan 100 MG tablet Commonly known as:  IMITREX Take 100 mg by mouth every 2 (two) hours as needed for migraine.   traZODone 100 MG tablet Commonly known as:  DESYREL Take 1 tablet by mouth at bedtime.       Please HPI for pertinent positives, otherwise complete 10 system ROS negative, except for chronic right plantar foot wound, nausea/vomiting  occasionally, dyspnea secondary to fluid accumulation.  Mallampati Score:    Physical Exam: BP 101/64 (BP Location: Right Arm)   Pulse (!) 104   Temp 98.8 F (37.1 C)   Resp 20   Ht 5' 4" (1.626 m)   Wt 247 lb (112 kg)   SpO2 97%   BMI 42.40 kg/m  Body mass index is 42.4 kg/m. General: chronically ill appearing white female who is laying in bed in NAD HEENT: head is normocephalic, atraumatic.  Sclera are noninjected.  PERRL.  Ears and nose without any masses or lesions.  Mouth is pink and moist.   Heart: regular, rate, and rhythm.  Normal s1,s2. No obvious murmurs, gallops, or rubs noted.  Palpable radial and pedal pulses bilaterally Lungs: CTAB, no wheezes, rhonchi, or rales noted.  Respiratory effort nonlabored Abd: distended, tight secondary to ascites, +BS, minimal tenderness to palpation. No appreciated hernias, unable to feel for organomegaly MS: her upper extremities are thin and atrophied, while her lower extremities have significant pitting edema and chronic venous stasis changes.  She has a chronic plantar right foot wound. Psych: A&Ox3 with an appropriate affect.   Labs: Results for orders placed or performed during the hospital encounter of 10/31/15 (from the past 48 hour(s))  MRSA PCR Screening     Status: None   Collection Time: 10/31/15  8:14 PM  Result Value Ref Range   MRSA by PCR NEGATIVE NEGATIVE    Comment:        The GeneXpert MRSA Assay (FDA approved for NASAL specimens only), is one component of a comprehensive MRSA colonization surveillance program. It is not intended to diagnose MRSA infection nor to guide or monitor treatment for MRSA infections.   Glucose, capillary     Status: Abnormal   Collection Time: 10/31/15  8:14 PM  Result Value Ref Range   Glucose-Capillary 131 (H) 65 - 99 mg/dL  Sodium, urine, random     Status: None   Collection Time: 10/31/15 10:51 PM  Result Value Ref Range   Sodium, Ur <10 mmol/L  Urinalysis with microscopic  (not at [facility])     Status: Abnormal   Collection Time: 10/31/15 10:51 PM  Result Value Ref Range   Color, Urine AMBER (A) YELLOW    Comment: BIOCHEMICALS MAY BE AFFECTED BY COLOR   APPearance CLOUDY (A) CLEAR   Specific Gravity, Urine 1.024 1.005 - 1.030   pH 5.0 5.0 - 8.0   Glucose, UA NEGATIVE NEGATIVE mg/dL   Hgb urine dipstick LARGE (A) NEGATIVE   Bilirubin Urine NEGATIVE NEGATIVE   Ketones, ur NEGATIVE NEGATIVE mg/dL   Protein, ur 30 (A) NEGATIVE mg/dL   Nitrite NEGATIVE NEGATIVE   Leukocytes, UA SMALL (A) NEGATIVE   WBC, UA 0-5 0 - 5 WBC/hpf   RBC / HPF TOO NUMEROUS TO COUNT 0 - 5 RBC/hpf   Bacteria,  UA RARE (A) NONE SEEN   Squamous Epithelial / LPF 0-5 (A) NONE SEEN   Casts HYALINE CASTS (A) NEGATIVE  CBC WITH DIFFERENTIAL     Status: Abnormal   Collection Time: 11/01/15  3:31 AM  Result Value Ref Range   WBC 6.1 4.0 - 10.5 K/uL   RBC 3.78 (L) 3.87 - 5.11 MIL/uL   Hemoglobin 10.2 (L) 12.0 - 15.0 g/dL   HCT 32.1 (L) 36.0 - 46.0 %   MCV 84.9 78.0 - 100.0 fL   MCH 27.0 26.0 - 34.0 pg   MCHC 31.8 30.0 - 36.0 g/dL   RDW 16.6 (H) 11.5 - 15.5 %   Platelets 91 (L) 150 - 400 K/uL    Comment: SPECIMEN CHECKED FOR CLOTS PLATELET COUNT CONFIRMED BY SMEAR    Neutrophils Relative % 73 %   Neutro Abs 4.5 1.7 - 7.7 K/uL   Lymphocytes Relative 15 %   Lymphs Abs 0.9 0.7 - 4.0 K/uL   Monocytes Relative 8 %   Monocytes Absolute 0.5 0.1 - 1.0 K/uL   Eosinophils Relative 3 %   Eosinophils Absolute 0.2 0.0 - 0.7 K/uL   Basophils Relative 0 %   Basophils Absolute 0.0 0.0 - 0.1 K/uL  Protime-INR     Status: Abnormal   Collection Time: 11/01/15  3:31 AM  Result Value Ref Range   Prothrombin Time 19.3 (H) 11.4 - 15.2 seconds   INR 1.62   Comprehensive metabolic panel     Status: Abnormal   Collection Time: 11/01/15  3:31 AM  Result Value Ref Range   Sodium 132 (L) 135 - 145 mmol/L   Potassium 4.2 3.5 - 5.1 mmol/L   Chloride 106 101 - 111 mmol/L   CO2 18 (L) 22 - 32 mmol/L    Glucose, Bld 148 (H) 65 - 99 mg/dL   BUN 41 (H) 6 - 20 mg/dL   Creatinine, Ser 2.73 (H) 0.44 - 1.00 mg/dL   Calcium 7.9 (L) 8.9 - 10.3 mg/dL   Total Protein 5.9 (L) 6.5 - 8.1 g/dL   Albumin 3.4 (L) 3.5 - 5.0 g/dL   AST 17 15 - 41 U/L   ALT 14 14 - 54 U/L   Alkaline Phosphatase 77 38 - 126 U/L   Total Bilirubin 1.0 0.3 - 1.2 mg/dL   GFR calc non Af Amer 19 (L) >60 mL/min   GFR calc Af Amer 22 (L) >60 mL/min    Comment: (NOTE) The eGFR has been calculated using the CKD EPI equation. This calculation has not been validated in all clinical situations. eGFR's persistently <60 mL/min signify possible Chronic Kidney Disease.    Anion gap 8 5 - 15  Magnesium     Status: None   Collection Time: 11/01/15  3:31 AM  Result Value Ref Range   Magnesium 2.1 1.7 - 2.4 mg/dL  Phosphorus     Status: Abnormal   Collection Time: 11/01/15  3:31 AM  Result Value Ref Range   Phosphorus 5.9 (H) 2.5 - 4.6 mg/dL  Glucose, capillary     Status: Abnormal   Collection Time: 11/01/15  7:35 AM  Result Value Ref Range   Glucose-Capillary 139 (H) 65 - 99 mg/dL  Glucose, capillary     Status: Abnormal   Collection Time: 11/01/15 12:09 PM  Result Value Ref Range   Glucose-Capillary 135 (H) 65 - 99 mg/dL    Imaging: No results found.  Assessment/Plan 1. NASH -the patient has a MELD score today of  24.  She has a 3 month 20% mortality rate secondary to just her cirrhosis.  If she were to undergo a TIPS procedure without a definite bridge to a transplant, she would be at even higher risk for post procedure mortality as well as encephalopathy, etc.  Dr. Anselm Pancoast discussed this with the patient.  She has seen a hepatologist at [facility] in the past and apparently had discussed the possibility of a liver transplant.  Dr. Anselm Pancoast has discussed this patient with Dr. Amedeo Plenty as well.  We agree that a TIPS procedure alone is likely too high risk for this patient.  We would recommend transfer to [facility] for evaluation for transplant vs  TIPS with bridge to transplant.  The patient understands this and is agreeable to this plan. -we will continue to follow the patient while she is here for now.  Thank you for this interesting consult.  I greatly enjoyed meeting Bridget Shaw and look forward to participating in their care.  A copy of this report was sent to the requesting provider on this date.  Electronically Signed: Henreitta Cea 11/01/2015, 1:43 PM   I spent a total of 40 Minutes    in face to face in clinical consultation, greater than 50% of which was counseling/coordinating care for NASH with cirrhosis and ascites

## 2015-11-01 NOTE — Consult Note (Signed)
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 Per patient wound started over a year ago with small black area that her podiatrist decided to "cut on" and she developed "gangrene" after that. She is currently non ambulatory per patient report after her last hospitalization Pressure Ulcer POA: No Measurement:9cm x 5cm x 0.2cm  Wound DXI:PJAS, 100% pink, moist, some early granulation and evidence of re-epithelialization  Drainage (amount, consistency, odor) moderate, serosanguinous, not purulent  Periwound: intact, some hyperkeratosis Dressing procedure/placement/frequency: Silver hydrofiber for recalcitrant wound, antimicrobial effects. Antifungal powder to the periwound and foot per patient request to "keep foot dry".  Top with dry gauze, secure with kerlix. Change every other day.  Follow up with regular wound care center appointments at [facility].  Discussed POC with patient and bedside nurse.  Re consult if needed, will not follow at this time. Thanks  Dorien Mayotte [employer], RN,CNS, CWOCN [phone])

## 2015-11-01 NOTE — Consult Note (Signed)
Reason for Consult: Hepatorenal Syndrome Referring Physician: Dr. Reubin Milan  HPI: Bridget Shaw is an 55 y.o. Female with a past medical history significant for liver cirrhosis secondary to NASH, DM2, Hypothyroidism, COPD, bipolar disorder who was transferred from [facility] following large volume paracentesis with worsening renal function.  Patient was initially admitted to [facility] on 7/14 with ascites. Underwent large volume paracentesis at that time. Subsequently discharged on home dose Lasix and Spironolactone. Patient reports her ascites rapidly reaccumulated and her urine output decreased. She reports urinating only once daily with small volume during this time. She returned to [facility] on 7/23 for ascites with worsening abdominal pain and shortness of breath. Underwent paracentesis on 7/24 with 4L removal. Subsequently had jump in her Cr and was transferred for further care. Appears patients Cr was 1.2 about 6 months ago with a steady incline to 2.5 on arrival.  Today, she complains of abdominal distention and discomfort with shortness of breath. No recent fevers or chills. Does report decreased urine output. No blood in urine. Does report nausea with emesis this morning. No hematemesis.    Past Medical History:  Diagnosis Date  . Anxiety   . Bipolar disorder ([place])   . COPD (chronic obstructive pulmonary disease) ([place])   . Depression   . Diabetes mellitus without complication ([place])   . Hyperlipidemia   . Hypertension   . Hypothyroidism   . Liver cirrhosis secondary to NASH     Past Surgical History:  Procedure Laterality Date  . CHOLECYSTECTOMY      Family History  Problem Relation Age of Onset  . Lung cancer Mother   . Diabetes Mother   . Hypertension Mother   . Diabetes Father   . Hypertension Father     Social History:  reports that she quit smoking about 1 weeks ago. She has never used smokeless tobacco. She reports that she does not  drink alcohol or use drugs.  Allergies:  Allergies  Allergen Reactions  . Asa [Aspirin] Hives  . Bactrim [Sulfamethoxazole-Trimethoprim] Hives  . Cephalexin Hives  . Sulfa Antibiotics Hives    Medications: I have reviewed the patient's current medications.  Results for orders placed or performed during the hospital encounter of 10/31/15 (from the past 48 hour(s))  MRSA PCR Screening     Status: None   Collection Time: 10/31/15  8:14 PM  Result Value Ref Range   MRSA by PCR NEGATIVE NEGATIVE    Comment:        The GeneXpert MRSA Assay (FDA approved for NASAL specimens only), is one component of a comprehensive MRSA colonization surveillance program. It is not intended to diagnose MRSA infection nor to guide or monitor treatment for MRSA infections.   Glucose, capillary     Status: Abnormal   Collection Time: 10/31/15  8:14 PM  Result Value Ref Range   Glucose-Capillary 131 (H) 65 - 99 mg/dL  Sodium, urine, random     Status: None   Collection Time: 10/31/15 10:51 PM  Result Value Ref Range   Sodium, Ur <10 mmol/L  Urinalysis with microscopic (not at [facility])     Status: Abnormal   Collection Time: 10/31/15 10:51 PM  Result Value Ref Range   Color, Urine AMBER (A) YELLOW    Comment: BIOCHEMICALS MAY BE AFFECTED BY COLOR   APPearance CLOUDY (A) CLEAR   Specific Gravity, Urine 1.024 1.005 - 1.030   pH 5.0 5.0 - 8.0   Glucose, UA NEGATIVE NEGATIVE mg/dL  Hgb urine dipstick LARGE (A) NEGATIVE   Bilirubin Urine NEGATIVE NEGATIVE   Ketones, ur NEGATIVE NEGATIVE mg/dL   Protein, ur 30 (A) NEGATIVE mg/dL   Nitrite NEGATIVE NEGATIVE   Leukocytes, UA SMALL (A) NEGATIVE   WBC, UA 0-5 0 - 5 WBC/hpf   RBC / HPF TOO NUMEROUS TO COUNT 0 - 5 RBC/hpf   Bacteria, UA RARE (A) NONE SEEN   Squamous Epithelial / LPF 0-5 (A) NONE SEEN   Casts HYALINE CASTS (A) NEGATIVE  CBC WITH DIFFERENTIAL     Status: Abnormal   Collection Time: 11/01/15  3:31 AM  Result Value Ref Range   WBC 6.1  4.0 - 10.5 K/uL   RBC 3.78 (L) 3.87 - 5.11 MIL/uL   Hemoglobin 10.2 (L) 12.0 - 15.0 g/dL   HCT 32.1 (L) 36.0 - 46.0 %   MCV 84.9 78.0 - 100.0 fL   MCH 27.0 26.0 - 34.0 pg   MCHC 31.8 30.0 - 36.0 g/dL   RDW 16.6 (H) 11.5 - 15.5 %   Platelets 91 (L) 150 - 400 K/uL    Comment: SPECIMEN CHECKED FOR CLOTS PLATELET COUNT CONFIRMED BY SMEAR    Neutrophils Relative % 73 %   Neutro Abs 4.5 1.7 - 7.7 K/uL   Lymphocytes Relative 15 %   Lymphs Abs 0.9 0.7 - 4.0 K/uL   Monocytes Relative 8 %   Monocytes Absolute 0.5 0.1 - 1.0 K/uL   Eosinophils Relative 3 %   Eosinophils Absolute 0.2 0.0 - 0.7 K/uL   Basophils Relative 0 %   Basophils Absolute 0.0 0.0 - 0.1 K/uL  Protime-INR     Status: Abnormal   Collection Time: 11/01/15  3:31 AM  Result Value Ref Range   Prothrombin Time 19.3 (H) 11.4 - 15.2 seconds   INR 1.62   Comprehensive metabolic panel     Status: Abnormal   Collection Time: 11/01/15  3:31 AM  Result Value Ref Range   Sodium 132 (L) 135 - 145 mmol/L   Potassium 4.2 3.5 - 5.1 mmol/L   Chloride 106 101 - 111 mmol/L   CO2 18 (L) 22 - 32 mmol/L   Glucose, Bld 148 (H) 65 - 99 mg/dL   BUN 41 (H) 6 - 20 mg/dL   Creatinine, Ser 2.73 (H) 0.44 - 1.00 mg/dL   Calcium 7.9 (L) 8.9 - 10.3 mg/dL   Total Protein 5.9 (L) 6.5 - 8.1 g/dL   Albumin 3.4 (L) 3.5 - 5.0 g/dL   AST 17 15 - 41 U/L   ALT 14 14 - 54 U/L   Alkaline Phosphatase 77 38 - 126 U/L   Total Bilirubin 1.0 0.3 - 1.2 mg/dL   GFR calc non Af Amer 19 (L) >60 mL/min   GFR calc Af Amer 22 (L) >60 mL/min    Comment: (NOTE) The eGFR has been calculated using the CKD EPI equation. This calculation has not been validated in all clinical situations. eGFR's persistently <60 mL/min signify possible Chronic Kidney Disease.    Anion gap 8 5 - 15  Magnesium     Status: None   Collection Time: 11/01/15  3:31 AM  Result Value Ref Range   Magnesium 2.1 1.7 - 2.4 mg/dL  Phosphorus     Status: Abnormal   Collection Time: 11/01/15  3:31  AM  Result Value Ref Range   Phosphorus 5.9 (H) 2.5 - 4.6 mg/dL  Glucose, capillary     Status: Abnormal   Collection Time: 11/01/15  7:35 AM  Result Value Ref Range   Glucose-Capillary 139 (H) 65 - 99 mg/dL    No results found.  Review of Systems  Constitutional: Negative for chills and fever.  Respiratory: Positive for shortness of breath.   Gastrointestinal: Positive for abdominal pain, nausea and vomiting.  Genitourinary: Negative for dysuria, frequency and urgency.  Musculoskeletal: Positive for back pain.  Skin: Negative for rash.   Blood pressure 101/64, pulse (!) 104, temperature 98.8 F (37.1 C), resp. rate 20, height 5' 4" (1.626 m), weight 247 lb (112 kg), SpO2 97 %. Physical Exam General: alert, drowsy appearing female in no acute distress Head: normocephalic and atraumatic.  Eyes: vision grossly intact, pupils equal, pupils round, pupils reactive to light, icteric sclerae . Fundi benign Mouth: pharynx pink and moist, no erythema, and no exudates.  Neck: supple, full ROM, no thyromegal, and no carotid bruits.  Lungs: normal respiratory effort, no accessory muscle use, normal breath sounds, no crackles, and no wheezes. Decreased bs, rales in bases Heart: mildly tachycardic, regular rhythm, no murmur, no gallop, and no rub. Gr2/6 M Abdomen: severely distended with gross ascites, mild diffuse tenderness, no guarding, no rebound. BS diminished.  Msk: right foot ulcer, no discharge. Diffuse muscle wasting.  Extremities: 3+ pitting edema bilaterally  Neurologic: alert & oriented X3 Psych: normal mood and affect   Assessment/Plan: 1 Hepatorenal Syndrome: Patient with rising Cr following large volume paracentesis. Patient has diffusely wasted muscles and suspect renal function is worse than Cr indicates. Urine Na < 10 indicating this is not ATN and points towards hepatorenal syndrome type 1. NO FURTHER LARGE VOLUME PARACENTESIS. Stop diuretics and avoid any nephrotoxic  agents. Continue octreotide and start midodrine 10 mg tid and albumin 25 mg q8hr. If unable to maintain BP may require transfer to ICU for norepi. Will check renal US as well. Continue Rocephin to cover for SBP. IR to evaluated for potential TIPS.  2 Hypotension: 2/2 to above. Treatment per above.  3 Cirrhosis from [place], need GI input for poss TIPs 4 DM 5 Bipolar 6 Ms wasting 7 Coagulopathy secondary to #3 8 low bps 9. Foot ulcer , ?PVD  Maryellen Pile 11/01/2015, 11:58 AM  [facility] PGY-2 [phone] I have seen and examined this patient and agree with the plan of care seen, eval, examined, counseled patient, discussed with primary and resident.  Grave outlook.   .  Kyo Cocuzza L 11/01/2015, 12:30 PM

## 2015-11-01 NOTE — Discharge Summary (Addendum)
Physician Discharge Summary  Bridget Shaw UJW:[id number] DOB: 06-29-1960 DOA: 10/31/2015  PCP: Jerold Coombe  Admit date: 10/31/2015 Discharge date: 11/04/2015  Addendum:  pt was accepted at [facility] on 7/26 but got discharged there on 11/04/2015  Admitted From: [facility] Disposition:  [facility]  Recommendations for Outpatient Follow-up:  1. Discharged to [facility] when bed available. Accepting physician is Viviann Spare Zacks(Hepatologist)    Discharge Condition: Guarded CODE STATUS: Full Code Diet recommendation: Heart Healthy / Carb Modified     Discharge Diagnoses:  Principal Problem:   Acute on chronic renal failure (HCC)   Active Problems:   Oliguric renal failure   Decompensated hepatic cirrhosis (HCC)   Abdominal pain   Bipolar disorder (HCC)   Ascites   COPD exacerbation (HCC)   Chronic wound of right foot   Peripheral vascular disease (HCC)  Brief narrative/history of present illness 55 year old female with history of NASH  liver cirrhosis who follows with hepatologist Dr. Piedad Climes at [facility] (has not been seen since October 2016), diabetes mellitus type 2, hypothyroidism, COPD, bipolar disorder who was admitted to [facility] where she was initially admitted with hepatic encephalopathy and had 10 L ascites fluid removed. She was discharged home and low-dose Lasix and Aldactone but returned on 7/23 with worsening abdominal pain. Patient was having progressively elevated creatinine. (Creatinine 6 months back was normal at 1.2). She saw signs of decompensated cirrhosis with significant ascites. Patient underwent repeat paracentesis with about 4 L fluid removed. She was then gently hydrated and given IV albumin. She was placed empirically on IV Rocephin (although SBP was ruled out). Since patient continued to be or alleviated with renal function progressively worsened the hospitalist at [facility] consulted [facility] and was  recommended that patient may benefit from T IPSS procedure for volume control and decrease in the need for paracentesis. [facility] did not have a bed and patient was sent for admission and possible TIPSS to [facility].  Hospital course Decompensated liver cirrhosis with severe ascites and/or oliguric renal failure MELD score of 24 Patient has progressively elevated creatinine following large-volume paracentesis. Her urine sodium is <10 which suggested this is unlikely ATN or underlying diabetic nephropathy. (Has mild proteinuria only). Symptoms concerning for hepatorenal syndrome. Appreciate renal and GI evaluation. No further paracenteses performed and diuretics have been discontinued. Patient has been started on acute right drip and midodrine 10 mg 3 times a day along with IV albumin 25 mg every 8 hours has been started. Blood pressure remains low normal or has been stable on telemetry. Empiric Rocephin to cover for SBP. Both GI and IR recommends against TIPSS. No signs of GI bleed or acute encephalopathy.  I have spoken with Dr. Brooke Dare , a pathologist at [facility] who has accepted the patient.   Chronic foot ulcer  Hypotension Diuretics held and started on octreotide drip with midodrine and IV albumin.  Diabetes mellitus type 2 Monitor on sliding scale coverage.  Hypothyroidism Continue Synthroid  Hyperlipidemia Held statin given concern for worsening liver function.    Discharge Instructions     Medication List    STOP taking these medications   ciprofloxacin 500 MG tablet Commonly known as:  CIPRO   furosemide 40 MG tablet Commonly known as:  LASIX   LYRICA 100 MG capsule Generic drug:  pregabalin   pravastatin 20 MG tablet Commonly known as:  PRAVACHOL   SUMAtriptan 100 MG tablet Commonly known as:  IMITREX  traZODone 100 MG tablet Commonly known as:  DESYREL  propranolol 20 MG tablet Commonly known as:  INDERAL  lactulose 10 GM/15ML  solution Commonly known as:  CHRONULAC    TAKE these medications   buPROPion 200 MG 12 hr tablet Commonly known as:  WELLBUTRIN SR Take 1 tablet by mouth 2 (two) times daily.   cefTRIAXone 2 g in dextrose 5 % 50 mL Inject 2 g into the vein daily.   clotrimazole 1 % cream Commonly known as:  LOTRIMIN Apply 1 application topically daily as needed.   COMBIVENT RESPIMAT 20-100 MCG/ACT Aers respimat Generic drug:  Ipratropium-Albuterol Inhale 2 puffs into the lungs daily as needed.     levothyroxine 50 MCG tablet Commonly known as:  SYNTHROID, LEVOTHROID Take 1 tablet by mouth every morning.   midodrine 10 MG tablet Commonly known as:  PROAMATINE Take 1 tablet (10 mg total) by mouth 3 (three) times daily with meals.   mometasone 50 MCG/ACT nasal spray Commonly known as:  NASONEX Place 1 spray into the nose daily as needed.   nystatin powder Commonly known as:  MYCOSTATIN/NYSTOP Apply 1 application topically daily. Apply to right foot after shower   octreotide 500 mcg in sodium chloride 0.9 % 250 mL Inject 12.5 mcg/hr into the vein continuous.   omeprazole 40 MG capsule Commonly known as:  PRILOSEC Take 1 capsule by mouth 2 (two) times daily.   ondansetron 4 MG tablet Commonly known as:  ZOFRAN Take 1 tablet (4 mg total) by mouth every 6 (six) hours as needed for nausea.   oxyCODONE 15 MG immediate release tablet Commonly known as:  ROXICODONE Take 1-2 tablets by mouth See admin instructions. Pt takes 2 tablets in the morning, and 1 tablet before bed          Allergies  Allergen Reactions  . Asa [Aspirin] Hives  . Bactrim [Sulfamethoxazole-Trimethoprim] Hives  . Cephalexin Hives  . Sulfa Antibiotics Hives    Consultations:  Nephrology  [facility]   Procedures/Studies: US Renal  Result Date: 11/01/2015 CLINICAL DATA:  Hepatopetal syndrome. EXAM: RENAL / URINARY TRACT ULTRASOUND COMPLETE COMPARISON:  CT, 07/27/2015 FINDINGS: Right Kidney: Length: 9.6  cm. Borderline increased renal parenchymal echogenicity. No mass, stone or hydronephrosis. Left Kidney: Length: 9.9 cm. Echogenicity within normal limits. No mass or hydronephrosis visualized. Bladder: Decompressed with a Foley catheter. Moderate amount of ascites seen throughout the abdomen. IMPRESSION: 1. Borderline increased renal parenchymal echogenicity of the right kidney suggesting medical renal disease. 2. No other abnormality of the kidneys.  No hydronephrosis. 3. Moderate ascites. Electronically Signed   By: Amie Portland M.D.   On: 11/01/2015 15:14      Subjective: Patient in discomfort with abdominal distention  Discharge Exam: Vitals:   11/01/15 0435 11/01/15 0842  BP: 108/72 101/64  Pulse: 96 (!) 104  Resp: 20 20  Temp: 98.8 F (37.1 C) 98.2 F (36.8 C)   Vitals:   10/31/15 2234 11/01/15 0435 11/01/15 0842 11/01/15 1300  BP:  108/72 101/64   Pulse:  96 (!) 104   Resp:  20 20   Temp:  98.8 F (37.1 C) 98.2 F (36.8 C)   TempSrc:   Oral   SpO2: 100% 96% 97%   Weight:      Height:    5\' 4"  (1.626 m)    General: Middle aged female in some discomfort with abdominal distention and some dyspnea on exertion HEENT: No pallor, anicteric, moist mucosa Cardiovascular: RRR, S1/S2 +, no  rubs, no gallops Respiratory: CTA bilaterally, no wheezing, no rhonchi Abdominal: Severely distended with ascites, nontender, Extremities: 3+ pitting edema bilaterally CNS: Alert and oriented, no tremors    The results of significant diagnostics from this hospitalization (including imaging, microbiology, ancillary and laboratory) are listed below for reference.     Microbiology: Recent Results (from the past 240 hour(s))  MRSA PCR Screening     Status: None   Collection Time: 10/31/15  8:14 PM  Result Value Ref Range Status   MRSA by PCR NEGATIVE NEGATIVE Final    Comment:        The GeneXpert MRSA Assay (FDA approved for NASAL specimens only), is one component of  a comprehensive MRSA colonization surveillance program. It is not intended to diagnose MRSA infection nor to guide or monitor treatment for MRSA infections.      Labs: BNP (last 3 results) No results for input(s): BNP in the last 8760 hours. Basic Metabolic Panel:  Recent Labs Lab 11/01/15 0331  NA 132*  K 4.2  CL 106  CO2 18*  GLUCOSE 148*  BUN 41*  CREATININE 2.73*  CALCIUM 7.9*  MG 2.1  PHOS 5.9*   Liver Function Tests:  Recent Labs Lab 11/01/15 0331  AST 17  ALT 14  ALKPHOS 77  BILITOT 1.0  PROT 5.9*  ALBUMIN 3.4*   No results for input(s): LIPASE, AMYLASE in the last 168 hours. No results for input(s): AMMONIA in the last 168 hours. CBC:  Recent Labs Lab 11/01/15 0331  WBC 6.1  NEUTROABS 4.5  HGB 10.2*  HCT 32.1*  MCV 84.9  PLT 91*   Cardiac Enzymes: No results for input(s): CKTOTAL, CKMB, CKMBINDEX, TROPONINI in the last 168 hours. BNP: Invalid input(s): POCBNP CBG:  Recent Labs Lab 10/31/15 2014 11/01/15 0735 11/01/15 1209  GLUCAP 131* 139* 135*   D-Dimer No results for input(s): DDIMER in the last 72 hours. Hgb A1c No results for input(s): HGBA1C in the last 72 hours. Lipid Profile No results for input(s): CHOL, HDL, LDLCALC, TRIG, CHOLHDL, LDLDIRECT in the last 72 hours. Thyroid function studies No results for input(s): TSH, T4TOTAL, T3FREE, THYROIDAB in the last 72 hours.  Invalid input(s): FREET3 Anemia work up No results for input(s): VITAMINB12, FOLATE, FERRITIN, TIBC, IRON, RETICCTPCT in the last 72 hours. Urinalysis    Component Value Date/Time   COLORURINE AMBER (A) 10/31/2015 2251   APPEARANCEUR CLOUDY (A) 10/31/2015 2251   LABSPEC 1.024 10/31/2015 2251   PHURINE 5.0 10/31/2015 2251   GLUCOSEU NEGATIVE 10/31/2015 2251   HGBUR LARGE (A) 10/31/2015 2251   BILIRUBINUR NEGATIVE 10/31/2015 2251   KETONESUR NEGATIVE 10/31/2015 2251   PROTEINUR 30 (A) 10/31/2015 2251   NITRITE NEGATIVE 10/31/2015 2251    LEUKOCYTESUR SMALL (A) 10/31/2015 2251   Sepsis Labs Invalid input(s): PROCALCITONIN,  WBC,  LACTICIDVEN Microbiology Recent Results (from the past 240 hour(s))  MRSA PCR Screening     Status: None   Collection Time: 10/31/15  8:14 PM  Result Value Ref Range Status   MRSA by PCR NEGATIVE NEGATIVE Final    Comment:        The GeneXpert MRSA Assay (FDA approved for NASAL specimens only), is one component of a comprehensive MRSA colonization surveillance program. It is not intended to diagnose MRSA infection nor to guide or monitor treatment for MRSA infections.      Time coordinating discharge: Over 30 minutes  SIGNED:   Eddie North, MD  [facility] 11/01/2015, 4:25 PM Pager  If 7PM-7AM, please contact night-coverage [email or web address] Password [email or web address]

## 2015-11-02 DIAGNOSIS — K767 Hepatorenal syndrome: Principal | ICD-10-CM

## 2015-11-02 LAB — COMPREHENSIVE METABOLIC PANEL
ALT: 11 U/L — ABNORMAL LOW (ref 14–54)
AST: 18 U/L (ref 15–41)
Albumin: 3.6 g/dL (ref 3.5–5.0)
Alkaline Phosphatase: 73 U/L (ref 38–126)
Anion gap: 8 (ref 5–15)
BUN: 45 mg/dL — ABNORMAL HIGH (ref 6–20)
CHLORIDE: 104 mmol/L (ref 101–111)
CO2: 21 mmol/L — AB (ref 22–32)
Calcium: 8.3 mg/dL — ABNORMAL LOW (ref 8.9–10.3)
Creatinine, Ser: 3.04 mg/dL — ABNORMAL HIGH (ref 0.44–1.00)
GFR, EST AFRICAN AMERICAN: 19 mL/min — AB (ref >60)
GFR, EST NON AFRICAN AMERICAN: 16 mL/min — AB (ref >60)
Glucose, Bld: 128 mg/dL — ABNORMAL HIGH (ref 65–99)
POTASSIUM: 5.3 mmol/L — AB (ref 3.5–5.1)
SODIUM: 133 mmol/L — AB (ref 135–145)
Total Bilirubin: 1 mg/dL (ref 0.3–1.2)
Total Protein: 6.2 g/dL — ABNORMAL LOW (ref 6.5–8.1)

## 2015-11-02 LAB — GLUCOSE, CAPILLARY
GLUCOSE-CAPILLARY: 142 mg/dL — AB (ref 65–99)
GLUCOSE-CAPILLARY: 146 mg/dL — AB (ref 65–99)
GLUCOSE-CAPILLARY: 145 mg/dL — AB (ref 65–99)
GLUCOSE-CAPILLARY: 139 mg/dL — AB (ref 65–99)

## 2015-11-02 LAB — CBC WITH DIFFERENTIAL/PLATELET
BASOS ABS: 0 10*3/uL (ref 0.0–0.1)
Basophils Relative: 1 %
EOS ABS: 0.2 10*3/uL (ref 0.0–0.7)
EOS PCT: 3 %
HCT: 29.4 % — ABNORMAL LOW (ref 36.0–46.0)
Hemoglobin: 9.6 g/dL — ABNORMAL LOW (ref 12.0–15.0)
LYMPHS PCT: 18 %
Lymphs Abs: 1.1 10*3/uL (ref 0.7–4.0)
MCH: 27.3 pg (ref 26.0–34.0)
MCHC: 32.7 g/dL (ref 30.0–36.0)
MCV: 83.5 fL (ref 78.0–100.0)
MONO ABS: 0.6 10*3/uL (ref 0.1–1.0)
Monocytes Relative: 10 %
Neutro Abs: 4.1 10*3/uL (ref 1.7–7.7)
Neutrophils Relative %: 69 %
PLATELETS: 84 10*3/uL — AB (ref 150–400)
RBC: 3.52 MIL/uL — ABNORMAL LOW (ref 3.87–5.11)
RDW: 16.7 % — AB (ref 11.5–15.5)
WBC: 5.9 10*3/uL (ref 4.0–10.5)

## 2015-11-02 LAB — CREATININE, URINE, RANDOM: Creatinine, Urine: 151.89 mg/dL

## 2015-11-02 MED ORDER — CIPROFLOXACIN HCL 250 MG PO TABS
250.0000 mg | ORAL_TABLET | Freq: Every day | ORAL | Status: DC
  Administered 2015-11-02: 250 mg via ORAL
  Filled 2015-11-02: qty 1

## 2015-11-02 NOTE — Progress Notes (Signed)
   11/02/15 1538  Clinical Encounter Type  Visited With Patient  Visit Type Spiritual support  Referral From Nurse  Consult/Referral To Chaplain  Spiritual Encounters  Spiritual Needs Prayer;Emotional  Stress Factors  Patient Stress Factors Exhausted;Health changes  Chaplain visit made per nurse request, patient initially anxious, provided spiritual presence, emotional support, active listening and prayer. Chaplain will be available for further support as needed.

## 2015-11-02 NOTE — Care Management Important Message (Signed)
Important Message  Patient Details  Name: Amiliyah Delmonaco MRN: [id number] Date of Birth: [birth year]   Medicare Important Message Given:  Yes    Bernadette Hoit 11/02/2015, 11:47 AM

## 2015-11-02 NOTE — Progress Notes (Signed)
Subjective: Interval History: SBP stable in the low 100s. Patient with only 275 mL UOP. Ascites and LE edema unchanged today. Patient complains of abdominal pain. Denies any shortness of breath currently. No dizziness/lightheadenes.   Objective: Vital signs in last 24 hours: Temp:  [98 F (36.7 C)-98.6 F (37 C)] 98.2 F (36.8 C) (07/27 0900) Pulse Rate:  [74-100] 74 (07/27 0900) Resp:  [16-20] 16 (07/27 0900) BP: (102-165)/(62-85) 165/85 (07/27 0900) SpO2:  [96 %-98 %] 96 % (07/27 0900) Weight:  [251 lb (113.9 kg)] 251 lb (113.9 kg) (07/26 2144) Weight change: 4 lb (1.814 kg)  Intake/Output from previous day: 07/26 0701 - 07/27 0700 In: 568.6 [P.O.:300; I.V.:168.6; IV Piggyback:100] Out: 275 [Urine:275] Intake/Output this shift: No intake/output data recorded.  General: alert, drowsy appearing female in no acute distress Head: normocephalic and atraumatic.  Eyes: vision grossly intact, pupils equal, pupils round, pupils reactive to light, icteric sclerae . Fundi benign Mouth: pharynx pink and moist, no erythema, and no exudates.  Neck: supple, full ROM, no thyromegal, and no carotid bruits.  Lungs: normal respiratory effort, no accessory muscle use, normal breath sounds, no crackles, and no wheezes. Decreased bs, rales in bases Heart: RRR, no gallop, and no rub. Gr2/6 M Abdomen: severely distended with gross ascites, mild diffuse tenderness, no guarding, no rebound. BS diminished.  Msk: right foot ulcer, no discharge. Diffuse muscle wasting.  Extremities: 3+ pitting edema bilaterally  Neurologic: alert & oriented X3 Psych: normal mood and affect  Lab Results:  Recent Labs  11/01/15 0331 11/02/15 0431  WBC 6.1 5.9  HGB 10.2* 9.6*  HCT 32.1* 29.4*  PLT 91* 84*   BMET:   Recent Labs  11/01/15 0331 11/02/15 0431  NA 132* 133*  K 4.2 5.3*  CL 106 104  CO2 18* 21*  GLUCOSE 148* 128*  BUN 41* 45*  CREATININE 2.73* 3.04*  CALCIUM 7.9* 8.3*   No results for  input(s): PTH in the last 72 hours. Iron Studies: No results for input(s): IRON, TIBC, TRANSFERRIN, FERRITIN in the last 72 hours. CBG (last 3)   Recent Labs  11/01/15 1642 11/01/15 2129 11/02/15 0757  GLUCAP 125* 130* 142*     Studies/Results: US Renal  Result Date: 11/01/2015 CLINICAL DATA:  Hepatopetal syndrome. EXAM: RENAL / URINARY TRACT ULTRASOUND COMPLETE COMPARISON:  CT, 07/27/2015 FINDINGS: Right Kidney: Length: 9.6 cm. Borderline increased renal parenchymal echogenicity. No mass, stone or hydronephrosis. Left Kidney: Length: 9.9 cm. Echogenicity within normal limits. No mass or hydronephrosis visualized. Bladder: Decompressed with a Foley catheter. Moderate amount of ascites seen throughout the abdomen. IMPRESSION: 1. Borderline increased renal parenchymal echogenicity of the right kidney suggesting medical renal disease. 2. No other abnormality of the kidneys.  No hydronephrosis. 3. Moderate ascites. Electronically Signed   By: Amie Portland M.D.   On: 11/01/2015 15:14   I have reviewed the patient's current medications.  Assessment/Plan: 1 Hepatorenal Syndrome: Patient with rising Cr following large volume paracentesis. Patient has diffusely wasted muscles and suspect renal function is worse than Cr indicates. Urine Na < 10 indicating this is not ATN and points towards hepatorenal syndrome type 1. NO FURTHER LARGE VOLUME PARACENTESIS. Blood pressures have been stable overnight. Renal function worsening Cr 2.7 > 3.04 today. Minimal UOP.  -Diuretics stopped and avoiding any nephrotoxic agents.  -Continue octreotide, midodrine 10 mg tid and albumin 25 mg q8hr. If unable to maintain BP may require transfer to ICU for norepi.  -Renal US with borderline increased renal parenchymal echogenicity  of the right kidney, normal left.  -Rocephin to cover for SBP. Renally dose Cipro.  -Transfer to [facility] when bed available  Will follow K closely 2 Hypotension: 2/2 to above. Treatment per above.   3 Cirrhosis from [place], waiting on transfer to [facility] 4 DM 5 Bipolar 6 Ms wasting 7 Coagulopathy secondary to #3 8 low bps 9. Foot ulcer , ?PVD   LOS: 2 days   Valentino Nose 11/02/2015,10:46 AM  I have seen and examined this patient and agree with the plan of care seen , eval, counseled patient, discussed with reesident and primary. .  Melvin Whiteford L 11/02/2015, 10:56 AM

## 2015-11-02 NOTE — Progress Notes (Signed)
PROGRESS NOTE                                                                                                                                                                                                             Patient Demographics:    Bridget Shaw, is a 55 y.o. female, DOB - [birth year], ZOX:[id number]  Admit date - 10/31/2015   Admitting Physician Ozella Rocks, MD  Outpatient Primary MD for the patient is Jerold Coombe  LOS - 2  Outpatient Specialists: Dr Piedad Climes, heaptologist in [facility] [place]  No chief complaint on file.      Brief Narrative   55 year old female with history of NASH  liver cirrhosis who follows with hepatologist Dr. Piedad Climes at [facility] (has not been seen since October 2016), diabetes mellitus type 2, hypothyroidism, COPD, bipolar disorder who was admitted to [facility] where she was initially admitted with hepatic encephalopathy and had 10 L ascites fluid removed. She was discharged home and low-dose Lasix and Aldactone but returned on 7/23 with worsening abdominal pain. Patient was having progressively elevated creatinine. (Creatinine 6 months back was normal at 1.2). She saw signs of decompensated cirrhosis with significant ascites. Patient underwent repeat paracentesis with about 4 L fluid removed. She was then gently hydrated and given IV albumin. She was placed empirically on IV Rocephin (although SBP was ruled out). Since patient continued to be or alleviated with renal function progressively worsened the hospitalist at [facility] consulted [facility] and was recommended that patient may benefit from T IPSS procedure for volume control and decrease in the need for paracentesis. [facility] did not have a bed and patient was sent for admission and possible TIPSS to [facility].    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 Urine Na <10. Renal function worsening and pt remains oliguric.  Avoid further large volume paracentesis. Continue octeotride drip and midodrine . D/c lasix and aldactone. IV rocephin and cipro for SBP prophylaxis.  Active Problems:   Decompensated hepatic cirrhosis (HCC) Progressive ascites. Now with suspected HRS. Hepatology at [facility] consuslted and  accepted for transfer. Awaiting bed.  Chronic foot ulcer  Hypotension Diuretics held and started on octreotide drip with midodrine and IV albumin.  Diabetes mellitus type 2 Monitor on sliding scale coverage.  Hypothyroidism Continue Synthroid  Hyperlipidemia Held statin given concern for worsening liver function.   D/c summary completed on 7/26 for tx to [facility]   Code Status :  Full code, condition guarded  Family Communication  : none at bedside  Disposition Plan  : awaiting transfer to [facility]  Barriers For Discharge : awaiting bed at [facility]  Consults  :   Renal   [facility]  Procedures  : NONE  DVT Prophylaxis  :  scd   Lab Results  Component Value Date   PLT 84 (L) 11/02/2015    Antibiotics  :    Anti-infectives    Start     Dose/Rate Route Frequency Ordered Stop   11/03/15 2000  ciprofloxacin (CIPRO) tablet 250 mg     250 mg Oral Daily 11/02/15 0824     11/01/15 0000  cefTRIAXone 2 g in dextrose 5 % 50 mL     2 g 100 mL/hr over 30 Minutes Intravenous Every 24 hours 11/01/15 1623     10/31/15 2200  cefTRIAXone (ROCEPHIN) 2 g in dextrose 5 % 50 mL IVPB     2 g 100 mL/hr over 30 Minutes Intravenous Every 24 hours 10/31/15 2127     10/31/15 2145  ciprofloxacin (CIPRO) tablet 500 mg  Status:  Discontinued     500 mg Oral 2 times daily 10/31/15 2133 11/02/15 0824        Objective:   Vitals:   11/01/15 1856 11/01/15 2144 11/02/15 0556 11/02/15 0900  BP: 102/62 106/64 102/65 (!) 165/85  Pulse: 100  90 85 74  Resp: 20 16 16 16   Temp: 98.6 F (37 C) 98 F (36.7 C) 98.3 F (36.8 C) 98.2 F (36.8 C)  TempSrc: Oral Oral Oral Oral  SpO2: 98% 96% 96% 96%  Weight:  113.9 kg (251 lb)    Height:        Wt Readings from Last 3 Encounters:  11/01/15 113.9 kg (251 lb)     Intake/Output Summary (Last 24 hours) at 11/02/15 1621 Last data filed at 11/02/15 1400  Gross per 24 hour  Intake            779.3 ml  Output              375 ml  Net            404.3 ml     Physical Exam  General:appears in discomfort HEENT: No pallor, anicteric, moist mucosa Cardiovascular: RRR, S1/S2 +, no rubs, no gallops Respiratory: CTA bilaterally, no wheezing, no rhonchi Abdominal: Severely distended with ascites, nontender, Extremities: 3+ pitting edema bilaterally CNS: Alert and oriented, no tremors     Data Review:    CBC  Recent Labs Lab 11/01/15 0331 11/02/15 0431  WBC 6.1 5.9  HGB 10.2* 9.6*  HCT 32.1* 29.4*  PLT 91* 84*  MCV 84.9 83.5  MCH 27.0 27.3  MCHC 31.8 32.7  RDW 16.6* 16.7*  LYMPHSABS 0.9 1.1  MONOABS 0.5 0.6  EOSABS 0.2 0.2  BASOSABS 0.0 0.0    Chemistries   Recent Labs Lab 11/01/15 0331 11/02/15 0431  NA 132* 133*  K 4.2 5.3*  CL 106 104  CO2 18* 21*  GLUCOSE 148* 128*  BUN 41* 45*  CREATININE 2.73* 3.04*  CALCIUM 7.9* 8.3*  MG 2.1  --  AST 17 18  ALT 14 11*  ALKPHOS 77 73  BILITOT 1.0 1.0   ------------------------------------------------------------------------------------------------------------------ No results for input(s): CHOL, HDL, LDLCALC, TRIG, CHOLHDL, LDLDIRECT in the last 72 hours.  No results found for: HGBA1C ------------------------------------------------------------------------------------------------------------------ No results for input(s): TSH, T4TOTAL, T3FREE, THYROIDAB in the last 72 hours.  Invalid input(s):  FREET3 ------------------------------------------------------------------------------------------------------------------ No results for input(s): VITAMINB12, FOLATE, FERRITIN, TIBC, IRON, RETICCTPCT in the last 72 hours.  Coagulation profile  Recent Labs Lab 11/01/15 0331  INR 1.62    No results for input(s): DDIMER in the last 72 hours.  Cardiac Enzymes No results for input(s): CKMB, TROPONINI, MYOGLOBIN in the last 168 hours.  Invalid input(s): CK ------------------------------------------------------------------------------------------------------------------ No results found for: BNP  Inpatient Medications  Scheduled Meds: . albumin human  25 g Intravenous Q8H  . antiseptic oral rinse  7 mL Mouth Rinse BID  . buPROPion  200 mg Oral BID  . cefTRIAXone (ROCEPHIN)  IV  2 g Intravenous Q24H  . [START ON 11/03/2015] ciprofloxacin  250 mg Oral Q2000  . lactulose  20 g Oral BID  . levothyroxine  50 mcg Oral QAC breakfast  . metoCLOPramide  5 mg Oral BID  . midodrine  10 mg Oral TID WC  . pantoprazole  40 mg Oral Daily  . sodium chloride flush  3 mL Intravenous Q12H   Continuous Infusions: . sodium chloride 10 mL/hr at 11/02/15 1115  . octreotide  (SANDOSTATIN)    IV infusion 12.5 mcg/hr (10/31/15 2218)   PRN Meds:.fluticasone, ipratropium-albuterol, ondansetron **OR** ondansetron (ZOFRAN) IV, oxyCODONE, promethazine  Micro Results Recent Results (from the past 240 hour(s))  MRSA PCR Screening     Status: None   Collection Time: 10/31/15  8:14 PM  Result Value Ref Range Status   MRSA by PCR NEGATIVE NEGATIVE Final    Comment:        The GeneXpert MRSA Assay (FDA approved for NASAL specimens only), is one component of a comprehensive MRSA colonization surveillance program. It is not intended to diagnose MRSA infection nor to guide or monitor treatment for MRSA infections.     Radiology Reports US Renal  Result Date: 11/01/2015 CLINICAL DATA:  Hepatopetal  syndrome. EXAM: RENAL / URINARY TRACT ULTRASOUND COMPLETE COMPARISON:  CT, 07/27/2015 FINDINGS: Right Kidney: Length: 9.6 cm. Borderline increased renal parenchymal echogenicity. No mass, stone or hydronephrosis. Left Kidney: Length: 9.9 cm. Echogenicity within normal limits. No mass or hydronephrosis visualized. Bladder: Decompressed with a Foley catheter. Moderate amount of ascites seen throughout the abdomen. IMPRESSION: 1. Borderline increased renal parenchymal echogenicity of the right kidney suggesting medical renal disease. 2. No other abnormality of the kidneys.  No hydronephrosis. 3. Moderate ascites. Electronically Signed   By: Amie Portland M.D.   On: 11/01/2015 15:14   Time Spent in minutes 35   Eddie North M.D on 11/02/2015 at 4:21 PM  Between 7am to 7pm - Pager - [phone]  After 7pm go to [email or web address] - password [facility]  [facility] -  Office  [phone]

## 2015-11-03 LAB — CBC WITH DIFFERENTIAL/PLATELET
BASOS ABS: 0 10*3/uL (ref 0.0–0.1)
Basophils Relative: 1 %
Eosinophils Absolute: 0.1 10*3/uL (ref 0.0–0.7)
Eosinophils Relative: 2 %
HEMATOCRIT: 28.3 % — AB (ref 36.0–46.0)
HEMOGLOBIN: 9.4 g/dL — AB (ref 12.0–15.0)
LYMPHS PCT: 17 %
Lymphs Abs: 1 10*3/uL (ref 0.7–4.0)
MCH: 27.5 pg (ref 26.0–34.0)
MCHC: 33.2 g/dL (ref 30.0–36.0)
MCV: 82.7 fL (ref 78.0–100.0)
MONO ABS: 0.7 10*3/uL (ref 0.1–1.0)
MONOS PCT: 12 %
NEUTROS ABS: 4 10*3/uL (ref 1.7–7.7)
Neutrophils Relative %: 68 %
Platelets: 68 10*3/uL — ABNORMAL LOW (ref 150–400)
RBC: 3.42 MIL/uL — ABNORMAL LOW (ref 3.87–5.11)
RDW: 16.6 % — AB (ref 11.5–15.5)
WBC: 5.9 10*3/uL (ref 4.0–10.5)

## 2015-11-03 LAB — GLUCOSE, CAPILLARY
GLUCOSE-CAPILLARY: 142 mg/dL — AB (ref 65–99)
GLUCOSE-CAPILLARY: 119 mg/dL — AB (ref 65–99)
Glucose-Capillary: 141 mg/dL — ABNORMAL HIGH (ref 65–99)
Glucose-Capillary: 151 mg/dL — ABNORMAL HIGH (ref 65–99)

## 2015-11-03 LAB — COMPREHENSIVE METABOLIC PANEL
ALK PHOS: 74 U/L (ref 38–126)
ALT: 10 U/L — ABNORMAL LOW (ref 14–54)
AST: 19 U/L (ref 15–41)
Albumin: 4 g/dL (ref 3.5–5.0)
Anion gap: 12 (ref 5–15)
BILIRUBIN TOTAL: 0.8 mg/dL (ref 0.3–1.2)
BUN: 49 mg/dL — AB (ref 6–20)
CALCIUM: 8.4 mg/dL — AB (ref 8.9–10.3)
CO2: 16 mmol/L — ABNORMAL LOW (ref 22–32)
CREATININE: 3.35 mg/dL — AB (ref 0.44–1.00)
Chloride: 104 mmol/L (ref 101–111)
GFR calc Af Amer: 17 mL/min — ABNORMAL LOW (ref >60)
GFR, EST NON AFRICAN AMERICAN: 15 mL/min — AB (ref >60)
Glucose, Bld: 118 mg/dL — ABNORMAL HIGH (ref 65–99)
POTASSIUM: 5.3 mmol/L — AB (ref 3.5–5.1)
Sodium: 132 mmol/L — ABNORMAL LOW (ref 135–145)
TOTAL PROTEIN: 6.3 g/dL — AB (ref 6.5–8.1)

## 2015-11-03 LAB — PHOSPHORUS: Phosphorus: 5.6 mg/dL — ABNORMAL HIGH (ref 2.5–4.6)

## 2015-11-03 MED ORDER — CALCIUM ACETATE (PHOS BINDER) 667 MG PO CAPS
667.0000 mg | ORAL_CAPSULE | Freq: Three times a day (TID) | ORAL | Status: DC
  Administered 2015-11-03 (×2): 667 mg via ORAL
  Filled 2015-11-03 (×2): qty 1

## 2015-11-03 MED ORDER — SODIUM BICARBONATE 650 MG PO TABS
1300.0000 mg | ORAL_TABLET | Freq: Two times a day (BID) | ORAL | Status: DC
  Administered 2015-11-03 (×2): 1300 mg via ORAL
  Filled 2015-11-03 (×2): qty 2

## 2015-11-03 NOTE — Progress Notes (Signed)
Subjective: Interval History: BP have been soft in the 90-100s. Ascites and edema worsening. UOP dropping off. Patient with complaints of abdominal pain and shortness of breath.   Objective: Vital signs in last 24 hours: Temp:  [97.7 F (36.5 C)-98.7 F (37.1 C)] 97.8 F (36.6 C) (07/28 0900) Pulse Rate:  [77-84] 84 (07/28 0900) Resp:  [16] 16 (07/28 0900) BP: (99-114)/(54-77) 108/70 (07/28 0900) SpO2:  [97 %-99 %] 98 % (07/28 0900) Weight change:   Intake/Output from previous day: 07/27 0701 - 07/28 0700 In: 1320.2 [P.O.:960; I.V.:260.2; IV Piggyback:100] Out: 100 [Urine:100] Intake/Output this shift: Total I/O In: 243 [P.O.:240; I.V.:3] Out: -   General: alert, drowsy appearing female in no acute distress Head: normocephalic and atraumatic.  Eyes: vision grossly intact, pupils equal, pupils round, pupils reactive to light, icteric sclerae . Fundi benign Mouth: pharynx pink and moist, no erythema, and no exudates.  Neck: supple, full ROM, no thyromegal, and no carotid bruits.  Lungs: normal respiratory effort, no accessory muscle use, normal breath sounds, no crackles, and no wheezes. Decreased bs, rales in bases Heart: RRR, no gallop, and no rub. Gr2/6 M Abdomen: severely distended with gross ascites, mild diffuse tenderness, no guarding, no rebound. BS diminished.  Msk: right foot ulcer, no discharge. Diffuse muscle wasting.  Extremities: 3+ pitting edema bilaterally  Neurologic: alert & oriented X3 Psych: normal mood and affect  Lab Results:  Recent Labs  11/02/15 0431 11/03/15 0624  WBC 5.9 5.9  HGB 9.6* 9.4*  HCT 29.4* 28.3*  PLT 84* 68*   BMET:   Recent Labs  11/02/15 0431 11/03/15 0624  NA 133* 132*  K 5.3* 5.3*  CL 104 104  CO2 21* 16*  GLUCOSE 128* 118*  BUN 45* 49*  CREATININE 3.04* 3.35*  CALCIUM 8.3* 8.4*   No results for input(s): PTH in the last 72 hours. Iron Studies: No results for input(s): IRON, TIBC, TRANSFERRIN, FERRITIN in the last  72 hours. CBG (last 3)   Recent Labs  11/02/15 1638 11/02/15 2023 11/03/15 0819  GLUCAP 145* 139* 142*     Studies/Results: US Renal  Result Date: 11/01/2015 CLINICAL DATA:  Hepatopetal syndrome. EXAM: RENAL / URINARY TRACT ULTRASOUND COMPLETE COMPARISON:  CT, 07/27/2015 FINDINGS: Right Kidney: Length: 9.6 cm. Borderline increased renal parenchymal echogenicity. No mass, stone or hydronephrosis. Left Kidney: Length: 9.9 cm. Echogenicity within normal limits. No mass or hydronephrosis visualized. Bladder: Decompressed with a Foley catheter. Moderate amount of ascites seen throughout the abdomen. IMPRESSION: 1. Borderline increased renal parenchymal echogenicity of the right kidney suggesting medical renal disease. 2. No other abnormality of the kidneys.  No hydronephrosis. 3. Moderate ascites. Electronically Signed   By: Amie Portland M.D.   On: 11/01/2015 15:14   I have reviewed the patient's current medications.  Assessment/Plan: 1 Hepatorenal Syndrome: Patient with rising Cr following large volume paracentesis. Patient has diffusely wasted muscles and suspect renal function is worse than Cr indicates. Urine Na < 10 indicating this is not ATN and points towards hepatorenal syndrome type 1. NO FURTHER LARGE VOLUME PARACENTESIS.  Blood pressures soft overnight. Renal function continues to worsen Cr 3.04 > 3.35 today. UOP dropping off.  -Will increase octreotide to help support BP. Continue midodrine and albumin. If unable to maintain BP may require transfer to ICU for norepi.  -Continue ABX for SBP prophylaxis.  -Transfer to [facility] when bed available   -K stable. Will follow K closely -Start Phoslo -Patient's outlook grim. If no improvement, likely require dialysis  in the next 24-48 hours.   2. Acidosis - Start NaBicarb PO 3 Hypotension: 2/2 to above. Treatment per above.  4 Cirrhosis from [place], waiting on transfer to [facility] 5 DM 6 Bipolar 7 Ms wasting 8 Coagulopathy secondary to #3 9.  Foot ulcer , ?PVD   LOS: 3 days   Valentino Nose 11/03/2015,11:29 AM  I have seen and examined this patient and agree with the plan of care seen, eval, examined, discussed with resident, counseled patient. .  Briahnna Harries L 11/03/2015, 12:06 PM

## 2015-11-03 NOTE — Consult Note (Signed)
WOC consult requested for right foot wound; this was already performed on 7/26.  Please refer to previous consult note for assessment, measurements, and plan of care.Topical treatment orders have been provided for staff nurses to perform. Please re-consult if further assistance is needed.  Thank-you,  Cammie Mcgee MSN, RN, CWOCN, [place], CNS [phone]

## 2015-11-03 NOTE — Progress Notes (Addendum)
PROGRESS NOTE                                                                                                                                                                                                             Patient Demographics:    Bridget Shaw, is a 55 y.o. female, DOB - 02-08-1961, ZOX:[id number]  Admit date - 10/31/2015   Admitting Physician Ozella Rocks, MD  Outpatient Primary MD for the patient is Jerold Coombe  LOS - 3  Outpatient Specialists: Dr Piedad Climes, heaptologist in [facility] [place]  No chief complaint on file.      Brief Narrative   55 year old female with history of NASH  liver cirrhosis who follows with hepatologist Dr. Piedad Climes at [facility] (has not been seen since October 2016), diabetes mellitus type 2, hypothyroidism, COPD, bipolar disorder who was admitted to [facility] where she was initially admitted with hepatic encephalopathy and had 10 L ascites fluid removed. She was discharged home and low-dose Lasix and Aldactone but returned on 7/23 with worsening abdominal pain. Patient was having progressively elevated creatinine. (Creatinine 6 months back was normal at 1.2). She saw signs of decompensated cirrhosis with significant ascites. Patient underwent repeat paracentesis with about 4 L fluid removed. She was then gently hydrated and given IV albumin. She was placed empirically on IV Rocephin (although SBP was ruled out). Since patient continued to be or alleviated with renal function progressively worsened the hospitalist at [facility] consulted [facility] and was recommended that patient may benefit from T IPSS procedure for volume control and decrease in the need for paracentesis. [facility] did not have a bed and patient was sent for admission and possible TIPSS to [facility].    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 Urine Na <10. Renal function worsening and pt remains oliguric.  Avoid further large volume paracentesis. Continue octeotride drip and midodrine .(dose increased by renal).  D/c lasix and aldactone. IV rocephin for SBP prophylaxis. Getting closer to HD . (possibly in 24-48 hrs if not improved) MELD score: 26  Active Problems:   Decompensated hepatic cirrhosis (HCC) Progressive ascites.  Now with suspected HRS.  Hepatology at [facility] consuslted and  accepted for transfer.still  Awaiting bed.  Metabolic acidosis Added sodium bicarb tablets  Chronic foot ulcer appreciate wound care recommendations  Hypotension Diuretics held and started on octreotide drip with midodrine and IV albumin.  Diabetes mellitus type 2 Monitor on sliding scale coverage.  Hypothyroidism Continue Synthroid  Hyperlipidemia Held statin given concern for worsening liver function.   D/c summary completed on 7/26 for tx to [facility]   Code Status :  Full code, prognosis guarded. willtransfer her to stepdown unit  Family Communication  : none at bedside. Unable to reach son on the phone. Sister ( becky) updated on phone  Disposition Plan  : awaiting transfer to [facility]  Barriers For Discharge : awaiting bed at [facility]  Consults  :   Renal   [facility]  Procedures  : NONE  DVT Prophylaxis  :  scd   Lab Results  Component Value Date   PLT 68 (L) 11/03/2015    Antibiotics  :    Anti-infectives    Start     Dose/Rate Route Frequency Ordered Stop   11/03/15 2000  ciprofloxacin (CIPRO) tablet 250 mg  Status:  Discontinued     250 mg Oral Daily 11/02/15 0824 11/03/15 0800   11/01/15 0000  cefTRIAXone 2 g in dextrose 5 % 50 mL     2 g 100 mL/hr over 30 Minutes Intravenous Every 24 hours 11/01/15 1623     10/31/15 2200  cefTRIAXone (ROCEPHIN) 2 g in dextrose 5 % 50 mL IVPB     2  g 100 mL/hr over 30 Minutes Intravenous Every 24 hours 10/31/15 2127     10/31/15 2145  ciprofloxacin (CIPRO) tablet 500 mg  Status:  Discontinued     500 mg Oral 2 times daily 10/31/15 2133 11/02/15 0824        Objective:   Vitals:   11/02/15 1826 11/02/15 2102 11/03/15 0608 11/03/15 0900  BP: 114/72 107/77 (!) 99/54 108/70  Pulse: 84 83 77 84  Resp: Temp: 98.7 F (37.1 C) 97.8 F (36.6 C) 97.7 F (36.5 C) 97.8 F (36.6 C)  TempSrc: Oral Oral Oral Oral  SpO2: 97% 99% 97% 98%  Weight:      Height:        Wt Readings from Last 3 Encounters:  11/01/15 113.9 kg (251 lb)     Intake/Output Summary (Last 24 hours) at 11/03/15 1701 Last data filed at 11/03/15 1400  Gross per 24 hour  Intake          1480.11 ml  Output                0 ml  Net          1480.11 ml     Physical Exam  General: in discomfort HEENT: No pallor, anicteric, moist mucosa Cardiovascular: RRR, S1/S2 +, no rubs, no gallops Respiratory: CTA bilaterally, no wheezing, no rhonchi Abdominal: Severely distended with ascites, nontender, Extremities: 3+ pitting edema bilaterally CNS: Alert and oriented, no tremors     Data Review:    CBC  Recent Labs Lab 11/01/15 0331 11/02/15 0431 11/03/15 0624  WBC 6.1 5.9 5.9  HGB 10.2* 9.6* 9.4*  HCT 32.1* 29.4* 28.3*  PLT 91* 84* 68*  MCV 84.9 83.5 82.7  MCH 27.0 27.3 27.5  MCHC 31.8 32.7 33.2  RDW 16.6* 16.7* 16.6*  LYMPHSABS 0.9 1.1 1.0  MONOABS 0.5  0.6 0.7  EOSABS 0.2 0.2 0.1  BASOSABS 0.0 0.0 0.0    Chemistries   Recent Labs Lab 11/01/15 0331 11/02/15 0431 11/03/15 0624  NA 132* 133* 132*  K 4.2 5.3* 5.3*  CL 106 104 104  CO2 18* 21* 16*  GLUCOSE 148* 128* 118*  BUN 41* 45* 49*  CREATININE 2.73* 3.04* 3.35*  CALCIUM 7.9* 8.3* 8.4*  MG 2.1  --   --   AST 17 18 19   ALT 14 11* 10*  ALKPHOS 77 73 74  BILITOT 1.0 1.0 0.8    ------------------------------------------------------------------------------------------------------------------ No results for input(s): CHOL, HDL, LDLCALC, TRIG, CHOLHDL, LDLDIRECT in the last 72 hours.  No results found for: HGBA1C ------------------------------------------------------------------------------------------------------------------ No results for input(s): TSH, T4TOTAL, T3FREE, THYROIDAB in the last 72 hours.  Invalid input(s): FREET3 ------------------------------------------------------------------------------------------------------------------ No results for input(s): VITAMINB12, FOLATE, FERRITIN, TIBC, IRON, RETICCTPCT in the last 72 hours.  Coagulation profile  Recent Labs Lab 11/01/15 0331  INR 1.62    No results for input(s): DDIMER in the last 72 hours.  Cardiac Enzymes No results for input(s): CKMB, TROPONINI, MYOGLOBIN in the last 168 hours.  Invalid input(s): CK ------------------------------------------------------------------------------------------------------------------ No results found for: BNP  Inpatient Medications  Scheduled Meds: . albumin human  25 g Intravenous Q8H  . antiseptic oral rinse  7 mL Mouth Rinse BID  . buPROPion  200 mg Oral BID  . calcium acetate  667 mg Oral TID WC  . cefTRIAXone (ROCEPHIN)  IV  2 g Intravenous Q24H  . lactulose  20 g Oral BID  . levothyroxine  50 mcg Oral QAC breakfast  . metoCLOPramide  5 mg Oral BID  . midodrine  10 mg Oral TID WC  . pantoprazole  40 mg Oral Daily  . sodium bicarbonate  1,300 mg Oral BID  . sodium chloride flush  3 mL Intravenous Q12H   Continuous Infusions: . sodium chloride Stopped (11/03/15 0815)  . octreotide  (SANDOSTATIN)    IV infusion 50 mcg/hr (11/03/15 1700)   PRN Meds:.fluticasone, ipratropium-albuterol, ondansetron **OR** ondansetron (ZOFRAN) IV, oxyCODONE, promethazine  Micro Results Recent Results (from the past 240 hour(s))  MRSA PCR Screening     Status:  None   Collection Time: 10/31/15  8:14 PM  Result Value Ref Range Status   MRSA by PCR NEGATIVE NEGATIVE Final    Comment:        The GeneXpert MRSA Assay (FDA approved for NASAL specimens only), is one component of a comprehensive MRSA colonization surveillance program. It is not intended to diagnose MRSA infection nor to guide or monitor treatment for MRSA infections.     Radiology Reports US Renal  Result Date: 11/01/2015 CLINICAL DATA:  Hepatopetal syndrome. EXAM: RENAL / URINARY TRACT ULTRASOUND COMPLETE COMPARISON:  CT, 07/27/2015 FINDINGS: Right Kidney: Length: 9.6 cm. Borderline increased renal parenchymal echogenicity. No mass, stone or hydronephrosis. Left Kidney: Length: 9.9 cm. Echogenicity within normal limits. No mass or hydronephrosis visualized. Bladder: Decompressed with a Foley catheter. Moderate amount of ascites seen throughout the abdomen. IMPRESSION: 1. Borderline increased renal parenchymal echogenicity of the right kidney suggesting medical renal disease. 2. No other abnormality of the kidneys.  No hydronephrosis. 3. Moderate ascites. Electronically Signed   By: Amie Portland M.D.   On: 11/01/2015 15:14   Time Spent in minutes 35   Eddie North M.D on 11/03/2015 at 5:01 PM  Between 7am to 7pm - Pager - [phone]  After 7pm go to [email or web address] - password [email or web address]  [facility] -  Office  [phone]

## 2015-11-03 NOTE — Progress Notes (Addendum)
Patient received from 6E to room 2C02. CHG bath given, vitals taken & charted. Patient no c/o pain or SOB. Patient assisted to chair to eat dinner with assist of Breindy Meadow & 2 persons. Patients callbell, telephone & personal belongings are within reach of patient.

## 2015-11-04 NOTE — Progress Notes (Addendum)
Pt discharged to [facility], transported by [facility], with all patient belongings, vital signs stable, no apparent distress.

## 2015-11-04 NOTE — Progress Notes (Addendum)
Pt transferring to [facility] in [place], [place] room [phone]. Report called to Maralyn Sago, RN.

## 2015-12-12 DIAGNOSIS — R161 Splenomegaly, not elsewhere classified: Secondary | ICD-10-CM | POA: Diagnosis not present

## 2015-12-12 DIAGNOSIS — D61818 Other pancytopenia: Secondary | ICD-10-CM | POA: Diagnosis not present

## 2015-12-12 DIAGNOSIS — K7460 Unspecified cirrhosis of liver: Secondary | ICD-10-CM | POA: Diagnosis not present

## 2016-05-15 DIAGNOSIS — D638 Anemia in other chronic diseases classified elsewhere: Secondary | ICD-10-CM | POA: Diagnosis not present

## 2016-05-15 DIAGNOSIS — E039 Hypothyroidism, unspecified: Secondary | ICD-10-CM

## 2016-05-15 DIAGNOSIS — R4182 Altered mental status, unspecified: Secondary | ICD-10-CM | POA: Diagnosis not present

## 2016-05-15 DIAGNOSIS — I10 Essential (primary) hypertension: Secondary | ICD-10-CM

## 2016-05-15 DIAGNOSIS — R188 Other ascites: Secondary | ICD-10-CM

## 2016-05-15 DIAGNOSIS — K7290 Hepatic failure, unspecified without coma: Secondary | ICD-10-CM | POA: Diagnosis not present

## 2016-05-15 DIAGNOSIS — D696 Thrombocytopenia, unspecified: Secondary | ICD-10-CM

## 2016-05-15 DIAGNOSIS — A419 Sepsis, unspecified organism: Secondary | ICD-10-CM | POA: Diagnosis not present

## 2016-05-16 DIAGNOSIS — K7290 Hepatic failure, unspecified without coma: Secondary | ICD-10-CM | POA: Diagnosis not present

## 2016-05-16 DIAGNOSIS — A419 Sepsis, unspecified organism: Secondary | ICD-10-CM | POA: Diagnosis not present

## 2016-05-16 DIAGNOSIS — R4182 Altered mental status, unspecified: Secondary | ICD-10-CM | POA: Diagnosis not present

## 2016-05-16 DIAGNOSIS — D638 Anemia in other chronic diseases classified elsewhere: Secondary | ICD-10-CM | POA: Diagnosis not present

## 2016-05-17 DIAGNOSIS — K7290 Hepatic failure, unspecified without coma: Secondary | ICD-10-CM | POA: Diagnosis not present

## 2016-05-17 DIAGNOSIS — D638 Anemia in other chronic diseases classified elsewhere: Secondary | ICD-10-CM | POA: Diagnosis not present

## 2016-05-17 DIAGNOSIS — A419 Sepsis, unspecified organism: Secondary | ICD-10-CM | POA: Diagnosis not present

## 2016-05-17 DIAGNOSIS — R4182 Altered mental status, unspecified: Secondary | ICD-10-CM | POA: Diagnosis not present

## 2016-05-18 DIAGNOSIS — I10 Essential (primary) hypertension: Secondary | ICD-10-CM | POA: Diagnosis not present

## 2016-05-18 DIAGNOSIS — A419 Sepsis, unspecified organism: Secondary | ICD-10-CM | POA: Diagnosis not present

## 2016-05-18 DIAGNOSIS — K7290 Hepatic failure, unspecified without coma: Secondary | ICD-10-CM | POA: Diagnosis not present

## 2016-05-18 DIAGNOSIS — D696 Thrombocytopenia, unspecified: Secondary | ICD-10-CM | POA: Diagnosis not present

## 2016-05-18 DIAGNOSIS — R4182 Altered mental status, unspecified: Secondary | ICD-10-CM | POA: Diagnosis not present

## 2016-05-18 DIAGNOSIS — D638 Anemia in other chronic diseases classified elsewhere: Secondary | ICD-10-CM | POA: Diagnosis not present

## 2016-05-19 DIAGNOSIS — D696 Thrombocytopenia, unspecified: Secondary | ICD-10-CM

## 2016-05-19 DIAGNOSIS — E039 Hypothyroidism, unspecified: Secondary | ICD-10-CM

## 2016-05-19 DIAGNOSIS — K7290 Hepatic failure, unspecified without coma: Secondary | ICD-10-CM

## 2016-05-19 DIAGNOSIS — D638 Anemia in other chronic diseases classified elsewhere: Secondary | ICD-10-CM | POA: Diagnosis not present

## 2016-05-19 DIAGNOSIS — A419 Sepsis, unspecified organism: Secondary | ICD-10-CM

## 2016-05-19 DIAGNOSIS — R4182 Altered mental status, unspecified: Secondary | ICD-10-CM

## 2016-05-19 DIAGNOSIS — R188 Other ascites: Secondary | ICD-10-CM

## 2016-05-19 DIAGNOSIS — I10 Essential (primary) hypertension: Secondary | ICD-10-CM | POA: Diagnosis not present

## 2016-05-20 DIAGNOSIS — I10 Essential (primary) hypertension: Secondary | ICD-10-CM | POA: Diagnosis not present

## 2016-05-20 DIAGNOSIS — A419 Sepsis, unspecified organism: Secondary | ICD-10-CM | POA: Diagnosis not present

## 2016-05-20 DIAGNOSIS — K7290 Hepatic failure, unspecified without coma: Secondary | ICD-10-CM | POA: Diagnosis not present

## 2016-05-20 DIAGNOSIS — D638 Anemia in other chronic diseases classified elsewhere: Secondary | ICD-10-CM | POA: Diagnosis not present

## 2016-05-20 DIAGNOSIS — R4182 Altered mental status, unspecified: Secondary | ICD-10-CM | POA: Diagnosis not present

## 2016-05-21 DIAGNOSIS — D638 Anemia in other chronic diseases classified elsewhere: Secondary | ICD-10-CM | POA: Diagnosis not present

## 2016-05-21 DIAGNOSIS — K7290 Hepatic failure, unspecified without coma: Secondary | ICD-10-CM | POA: Diagnosis not present

## 2016-05-21 DIAGNOSIS — R4182 Altered mental status, unspecified: Secondary | ICD-10-CM | POA: Diagnosis not present

## 2016-05-21 DIAGNOSIS — A419 Sepsis, unspecified organism: Secondary | ICD-10-CM | POA: Diagnosis not present

## 2016-05-21 DIAGNOSIS — I10 Essential (primary) hypertension: Secondary | ICD-10-CM | POA: Diagnosis not present

## 2016-06-06 DEATH — deceased

## 2018-02-02 IMAGING — US US RENAL
1 series · 14 of 25 positions shown · non-contrast
Comparison: CT, 07/27/2015

CLINICAL DATA: Hepatopetal syndrome.

EXAM:
RENAL / URINARY TRACT ULTRASOUND COMPLETE

[Series 1: us renal · 0.22mm/px · 14 of 37 slices shown]
[im 1/37]
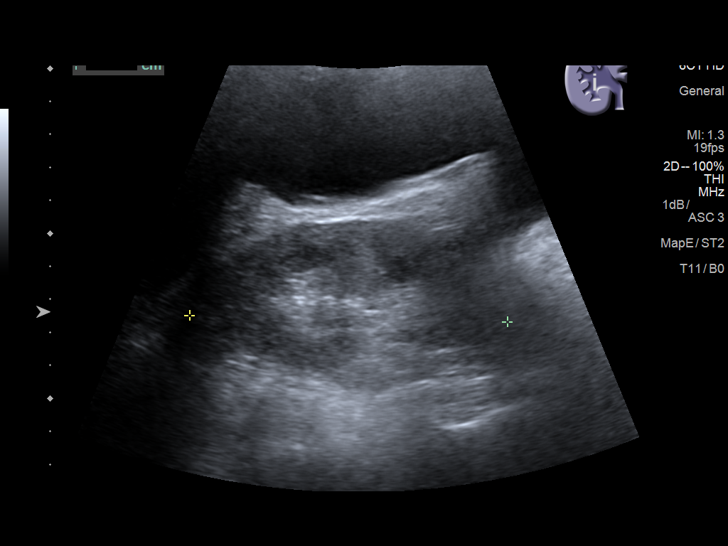
[im 4/37]
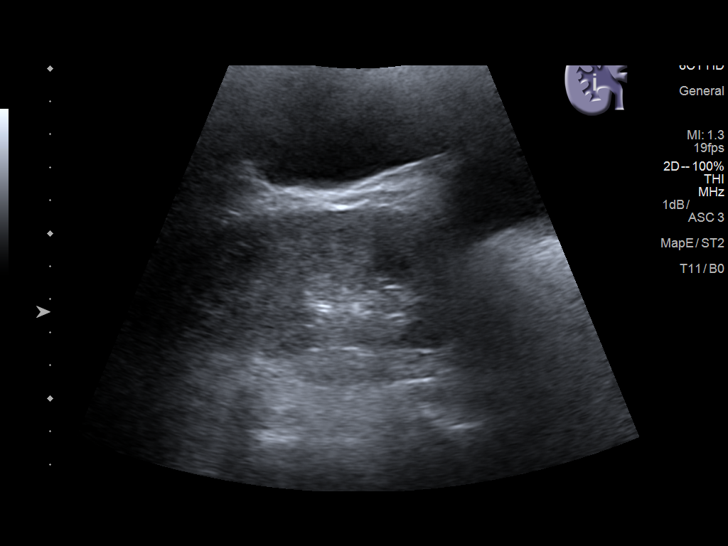
[im 7/37]
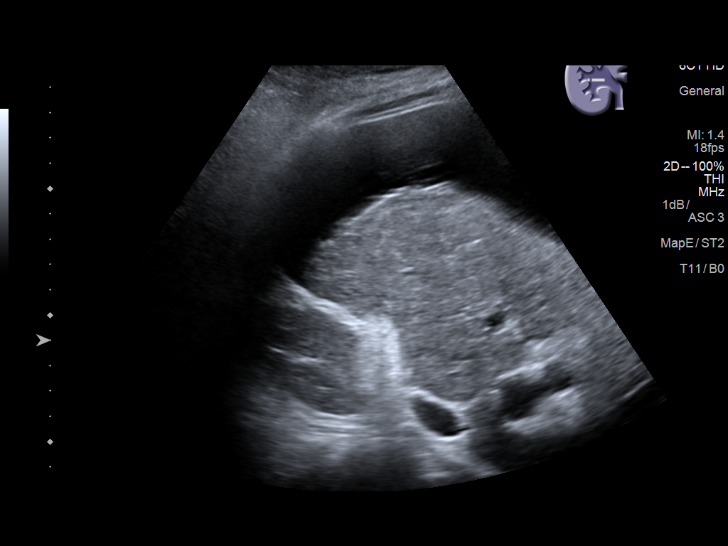
[im 10/37]
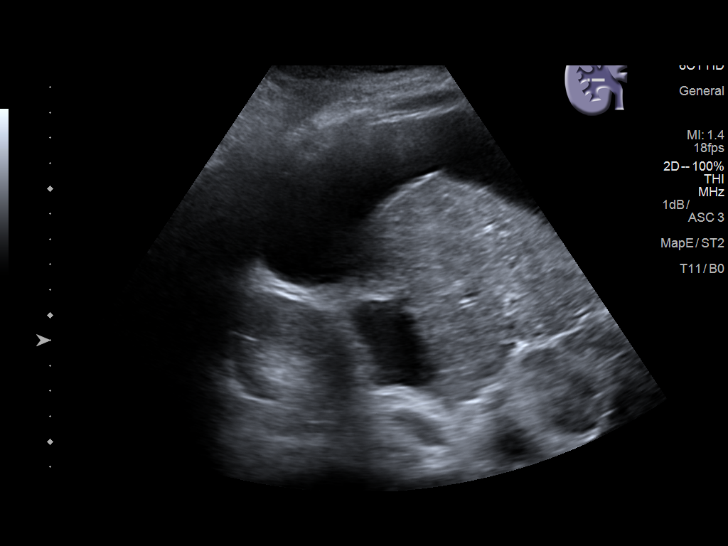
[im 13/37]
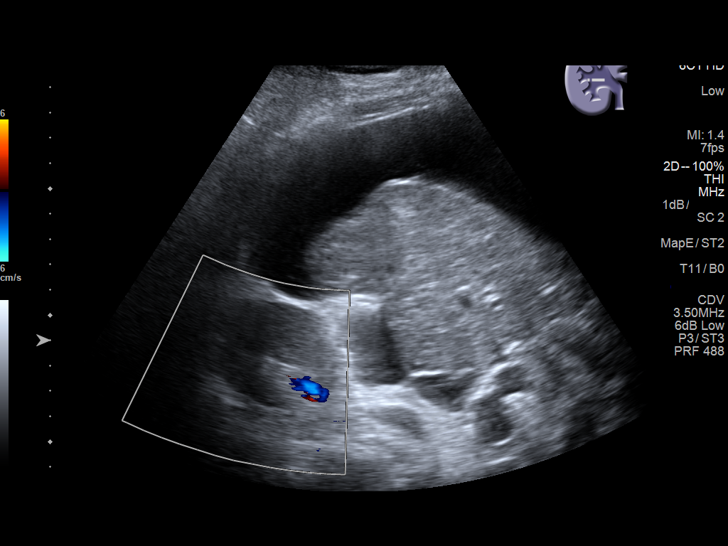
[im 14/37]
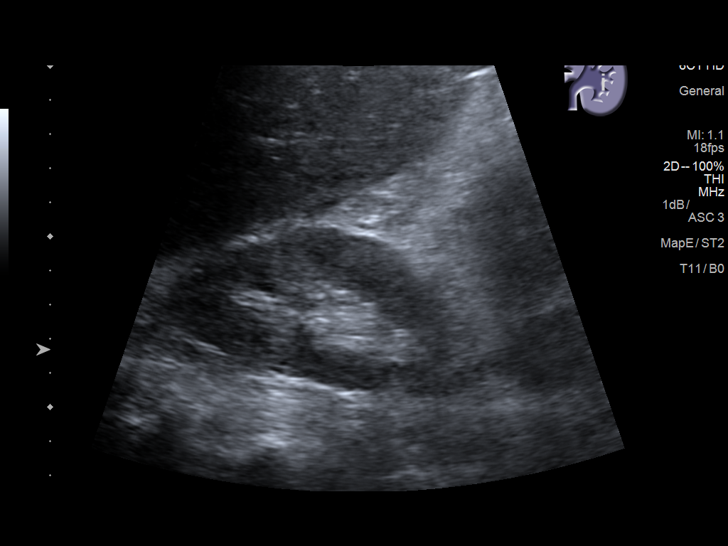
[im 17/37]
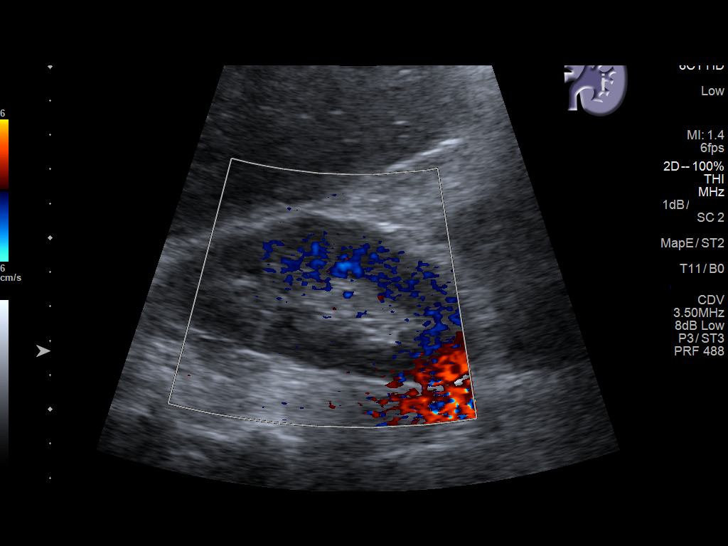
[im 20/37]
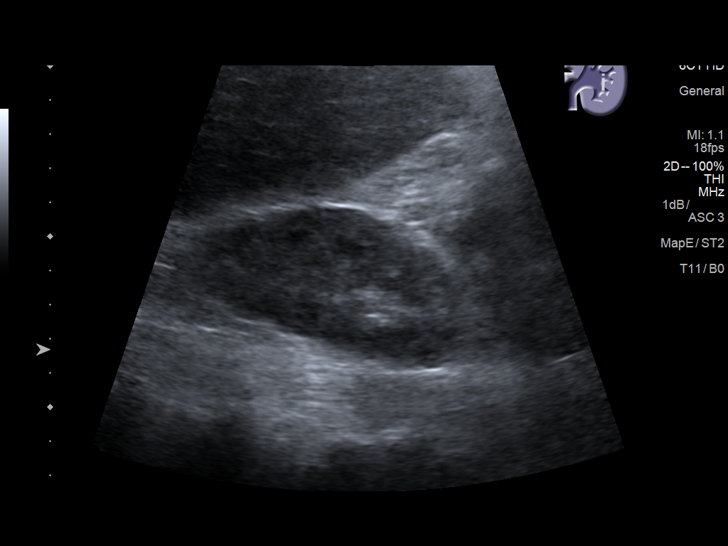
[im 23/37]
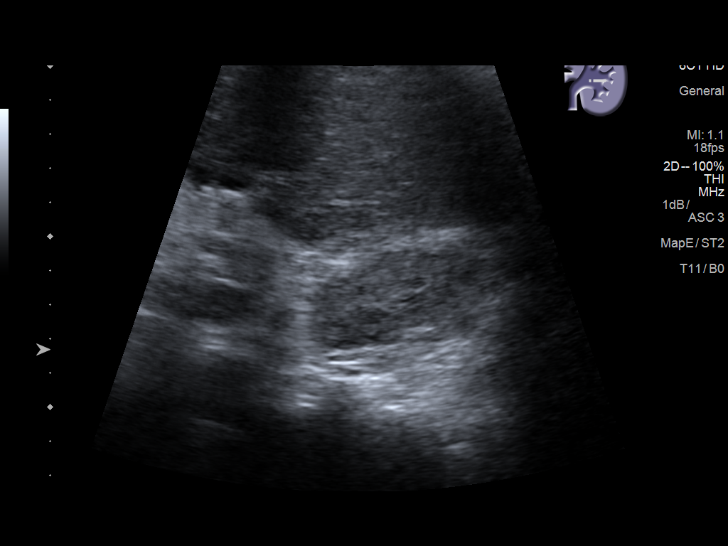
[im 25/37]
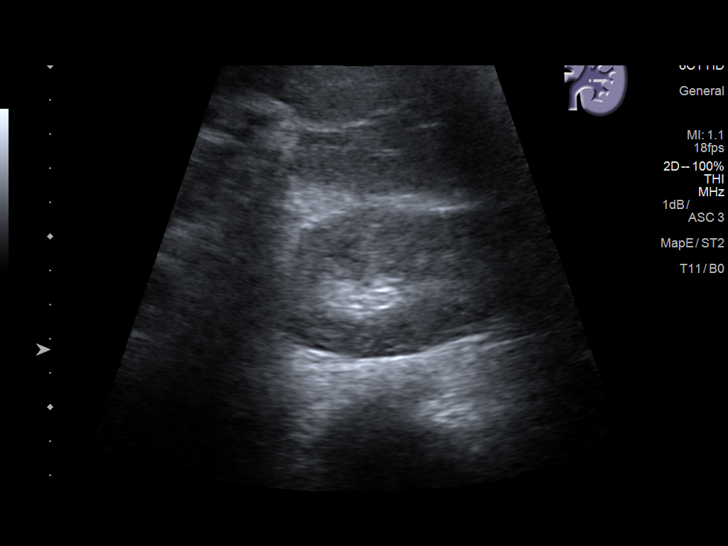
[im 28/37]
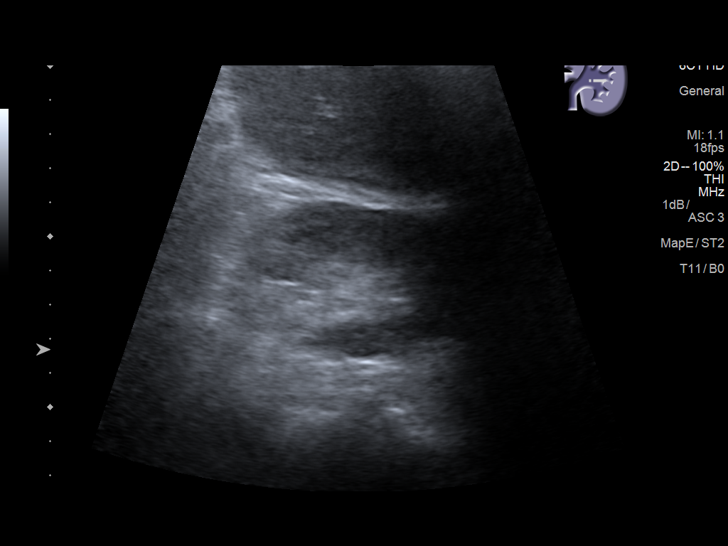
[im 31/37]
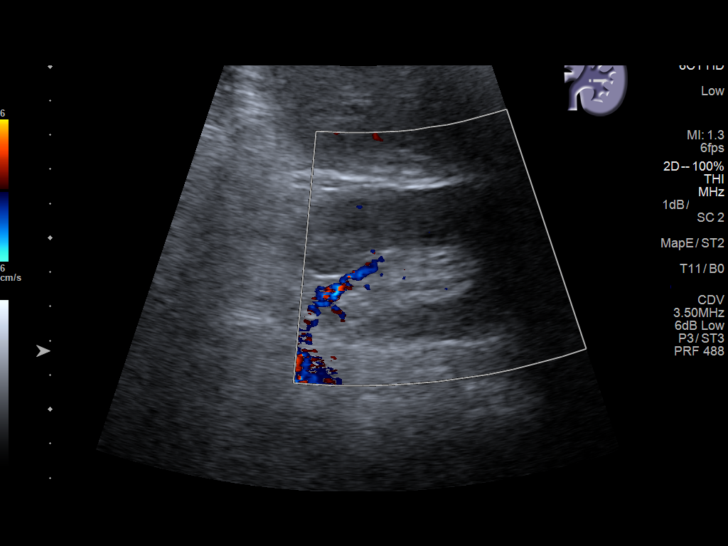
[im 34/37]
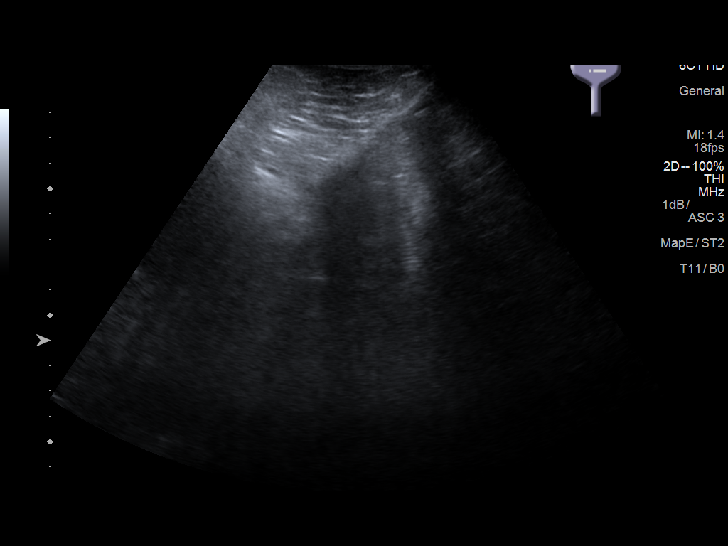
[im 37/37]
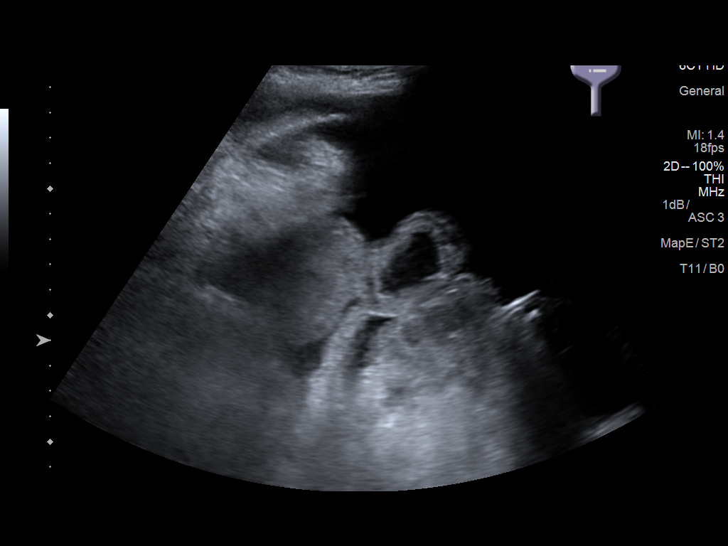

[14 of 25 positions shown; findings below may reference images not displayed]

FINDINGS: Right Kidney:

Length: 9.6 cm. Borderline increased renal parenchymal echogenicity.
No mass, stone or hydronephrosis.

Left Kidney:

Length: 9.9 cm. Echogenicity within normal limits. No mass or
hydronephrosis visualized.

Bladder:

Decompressed with a Foley catheter.

Moderate amount of ascites seen throughout the abdomen.
IMPRESSION: 1. Borderline increased renal parenchymal echogenicity of the right
kidney suggesting medical renal disease.
2. No other abnormality of the kidneys.  No hydronephrosis.
3. Moderate ascites.

## 2019-01-27 ENCOUNTER — Encounter: Payer: Self-pay | Admitting: Gastroenterology
